# Patient Record
Sex: Female | Born: 1965
Health system: Southern US, Community
[De-identification: ages and names within clinical notes are randomized; demographics above are authoritative.]

## PROBLEM LIST (undated history)

## (undated) ENCOUNTER — Ambulatory Visit: Discharge status: Absent without leave | Payer: 59

## (undated) DIAGNOSIS — K5790 Diverticulosis of intestine, part unspecified, without perforation or abscess without bleeding: Secondary | ICD-10-CM

## (undated) DIAGNOSIS — K219 Gastro-esophageal reflux disease without esophagitis: Secondary | ICD-10-CM

## (undated) DIAGNOSIS — Z862 Personal history of diseases of the blood and blood-forming organs and certain disorders involving the immune mechanism: Secondary | ICD-10-CM

## (undated) DIAGNOSIS — I8390 Asymptomatic varicose veins of unspecified lower extremity: Secondary | ICD-10-CM

## (undated) DIAGNOSIS — H9313 Tinnitus, bilateral: Secondary | ICD-10-CM

## (undated) DIAGNOSIS — E039 Hypothyroidism, unspecified: Secondary | ICD-10-CM

## (undated) DIAGNOSIS — M722 Plantar fascial fibromatosis: Secondary | ICD-10-CM

## (undated) DIAGNOSIS — M1990 Unspecified osteoarthritis, unspecified site: Secondary | ICD-10-CM

## (undated) DIAGNOSIS — G43909 Migraine, unspecified, not intractable, without status migrainosus: Secondary | ICD-10-CM

## (undated) DIAGNOSIS — I959 Hypotension, unspecified: Secondary | ICD-10-CM

## (undated) DIAGNOSIS — Z86010 Personal history of colonic polyps: Secondary | ICD-10-CM

## (undated) HISTORY — PX: OTHER SURGICAL HISTORY: SHX169

## (undated) HISTORY — DX: Gastro-esophageal reflux disease without esophagitis: K21.9

## (undated) HISTORY — DX: Hypothyroidism, unspecified: E03.9

## (undated) HISTORY — DX: Migraine, unspecified, not intractable, without status migrainosus: G43.909

## (undated) HISTORY — PX: PLANTAR FASCIA RELEASE: SHX2239

---

## 1999-10-06 ENCOUNTER — Other Ambulatory Visit: Admission: RE | Admit: 1999-10-06 | Discharge: 1999-10-06 | Payer: Self-pay | Admitting: Obstetrics & Gynecology

## 2001-09-27 ENCOUNTER — Other Ambulatory Visit: Admission: RE | Admit: 2001-09-27 | Discharge: 2001-09-27 | Payer: Self-pay | Admitting: Obstetrics & Gynecology

## 2003-04-16 ENCOUNTER — Other Ambulatory Visit: Admission: RE | Admit: 2003-04-16 | Discharge: 2003-04-16 | Payer: Self-pay | Admitting: Obstetrics & Gynecology

## 2004-06-17 ENCOUNTER — Other Ambulatory Visit: Admission: RE | Admit: 2004-06-17 | Discharge: 2004-06-17 | Payer: Self-pay | Admitting: Obstetrics & Gynecology

## 2010-02-22 HISTORY — PX: COLONOSCOPY W/ POLYPECTOMY: SHX1380

## 2010-09-22 ENCOUNTER — Ambulatory Visit (INDEPENDENT_AMBULATORY_CARE_PROVIDER_SITE_OTHER): Payer: 59 | Admitting: Gastroenterology

## 2010-09-22 ENCOUNTER — Encounter: Payer: Self-pay | Admitting: Gastroenterology

## 2010-09-22 VITALS — BP 98/64 | HR 66 | Temp 97.9°F | Ht 66.0 in | Wt 141.2 lb

## 2010-09-22 DIAGNOSIS — R1319 Other dysphagia: Secondary | ICD-10-CM | POA: Insufficient documentation

## 2010-09-22 DIAGNOSIS — R1310 Dysphagia, unspecified: Secondary | ICD-10-CM

## 2010-09-22 DIAGNOSIS — R1032 Left lower quadrant pain: Secondary | ICD-10-CM | POA: Insufficient documentation

## 2010-09-22 DIAGNOSIS — R198 Other specified symptoms and signs involving the digestive system and abdomen: Secondary | ICD-10-CM | POA: Insufficient documentation

## 2010-09-22 DIAGNOSIS — R1314 Dysphagia, pharyngoesophageal phase: Secondary | ICD-10-CM

## 2010-09-22 DIAGNOSIS — K219 Gastro-esophageal reflux disease without esophagitis: Secondary | ICD-10-CM

## 2010-09-22 DIAGNOSIS — K625 Hemorrhage of anus and rectum: Secondary | ICD-10-CM | POA: Insufficient documentation

## 2010-09-22 NOTE — Assessment & Plan Note (Signed)
Chronic heartburn. Takes TUMS when necessary. Never started omeprazole. No previous PPIs. Complains of solid food dysphagia. Recommend EGD with dilation in the near future. I have discussed the risks, alternatives, benefits with regards to but not limited to the risk of reaction to medication, bleeding, infection, perforation and the patient is agreeable to proceed. Written consent to be obtained.  Encouraged her to begin omeprazole 20 mg daily.

## 2010-09-22 NOTE — Assessment & Plan Note (Signed)
Indeterminate left lower quadrant abdominal pain associated with loose stool. Generally lasts for less than one day. Suspect irritable bowel syndrome. Less likely IBD. Cannot exclude diverticulitis but less likely given quick resolution of symptoms. Recommend colonoscopy for further evaluation. I have discussed the risks, alternatives, benefits with regards to but not limited to the risk of reaction to medication, bleeding, infection, perforation and the patient is agreeable to proceed. Written consent to be obtained. Plan for procedure 2-3 weeks from now.

## 2010-09-22 NOTE — Progress Notes (Signed)
Cc to PCP 

## 2010-09-22 NOTE — Assessment & Plan Note (Signed)
Alternating constipation and diarrhea. Intermittent rectal bleeding. Further evaluation via colonoscopy.

## 2010-09-22 NOTE — Progress Notes (Signed)
Primary Care Physician:  Harlow Asa, MD, MD  Primary Gastroenterologist:  Roetta Sessions, MD   Chief Complaint  Patient presents with  . Colonoscopy    HPI:  Madison Aguilar is a 45 y.o. female here at the request of Dr. Lubertha South for further evaluation LLQ pain. Saw him on July 19 with complaints of left lower quadrant pain associated with loose stool. She was suspected to have diverticulitis and was started on Cipro/Flagyl. She states her symptoms were better the next day. She actually didn't start the antibiotics for several days however. She states she really has had intermittent left lower quadrant pain which may last for a day at a time in the past as well. Stools go from loose to hard. Sometimes brbpr on toilet tissue. Episodes may come 2-3 times per year. Heartburn got worse over the last few weeks. Usually food related. TUMS prn. Given omeprazole recently but she has not had that prescription filled.  Sometimes dysphagia to solids foods which has become more frequently. No weight loss. No dysuria, hematuria.   Mother had severe diverticulitis requiring ileostomy.   Current Outpatient Prescriptions  Medication Sig Dispense Refill  . levothyroxine (SYNTHROID, LEVOTHROID) 100 MCG tablet Take 100 mcg by mouth daily.        Marland Kitchen omeprazole (PRILOSEC) 20 MG capsule  not taking.         Allergies as of 09/22/2010  . (No Known Allergies)    Past Medical History  Diagnosis Date  . Migraine headache   . Hypothyroid   . GERD (gastroesophageal reflux disease)     Past Surgical History  Procedure Date  . None     Family History  Problem Relation Age of Onset  . Diverticulitis Mother     ileostomy  . Diverticulitis Maternal Grandmother   . Colon cancer Neg Hx   . Liver disease Neg Hx   . Crohn's disease Neg Hx   . Ulcerative colitis Neg Hx     History   Social History  . Marital Status: Married    Spouse Name: N/A    Number of Children: 1  . Years of Education: N/A    Occupational History  . [employer] Barrister's clerk   Social History Main Topics  . Smoking status: Current Everyday Smoker -- 0.5 packs/day    Types: Cigarettes  . Smokeless tobacco: Not on file  . Alcohol Use: Yes     a glass of wine  . Drug Use: No  . Sexually Active: Not on file   Other Topics Concern  . Not on file   Social History Narrative  . No narrative on file      ROS:  General: Negative for anorexia, weight loss, fever, chills, fatigue, weakness. Eyes: Negative for vision changes.  ENT: Negative for hoarseness, nasal congestion. CV: Negative for chest pain, angina, palpitations, dyspnea on exertion, peripheral edema.  Respiratory: Negative for dyspnea at rest, dyspnea on exertion, cough, sputum, wheezing.  GI: See history of present illness. GU:  Negative for dysuria, hematuria, urinary incontinence, urinary frequency, nocturnal urination.  MS: Negative for joint pain, low back pain.  Derm: Negative for rash or itching.  Neuro: Negative for weakness, abnormal sensation, seizure, frequent headaches, memory loss, confusion.  Psych: Negative for anxiety, depression, suicidal ideation, hallucinations.  Endo: Negative for unusual weight change.  Heme: Negative for bruising or bleeding. Allergy: Negative for rash or hives.    Physical Examination:  BP 98/64  Pulse 66  Temp(Src) 97.9 F (36.6 C) (Temporal)  Ht 5\' 6"  (1.676 m)  Wt 141 lb 3.2 oz (64.048 kg)  BMI 22.79 kg/m2   General: Well-nourished, well-developed in no acute distress.  Head: Normocephalic, atraumatic.   Eyes: Conjunctiva pink, no icterus. Mouth: Oropharyngeal mucosa moist and pink , no lesions erythema or exudate. Neck: Supple without thyromegaly, masses, or lymphadenopathy.  Lungs: Clear to auscultation bilaterally.  Heart: Regular rate and rhythm, no murmurs rubs or gallops.  Abdomen: Bowel sounds are normal, nontender, nondistended, no hepatosplenomegaly or masses, no abdominal  bruits or hernia , no rebound or guarding.   Rectal: Deferred to time of colonoscopy. Extremities: No lower extremity edema. No clubbing or deformities.  Neuro: Alert and oriented x 4 , grossly normal neurologically.  Skin: Warm and dry, no rash or jaundice.   Psych: Alert and cooperative, normal mood and affect.

## 2010-09-23 DIAGNOSIS — K5790 Diverticulosis of intestine, part unspecified, without perforation or abscess without bleeding: Secondary | ICD-10-CM

## 2010-09-23 DIAGNOSIS — Z860100 Personal history of colon polyps, unspecified: Secondary | ICD-10-CM

## 2010-09-23 HISTORY — DX: Personal history of colonic polyps: Z86.010

## 2010-09-23 HISTORY — DX: Personal history of colon polyps, unspecified: Z86.0100

## 2010-09-23 HISTORY — DX: Diverticulosis of intestine, part unspecified, without perforation or abscess without bleeding: K57.90

## 2010-10-01 NOTE — Progress Notes (Signed)
AGREE

## 2010-10-12 ENCOUNTER — Other Ambulatory Visit: Payer: Self-pay | Admitting: General Practice

## 2010-10-14 ENCOUNTER — Encounter (HOSPITAL_COMMUNITY): Admission: RE | Payer: Self-pay | Source: Ambulatory Visit

## 2010-10-14 ENCOUNTER — Ambulatory Visit (HOSPITAL_COMMUNITY): Admission: RE | Admit: 2010-10-14 | Payer: 59 | Source: Ambulatory Visit | Admitting: Internal Medicine

## 2010-10-14 ENCOUNTER — Encounter: Payer: 59 | Admitting: Internal Medicine

## 2010-10-14 SURGERY — EGD (ESOPHAGOGASTRODUODENOSCOPY)
Anesthesia: Moderate Sedation

## 2010-10-15 ENCOUNTER — Other Ambulatory Visit: Payer: Self-pay | Admitting: General Practice

## 2010-10-15 DIAGNOSIS — K625 Hemorrhage of anus and rectum: Secondary | ICD-10-CM

## 2010-10-20 ENCOUNTER — Encounter (HOSPITAL_COMMUNITY)
Admission: RE | Disposition: A | Discharge status: Home / Self Care | Payer: Self-pay | Source: Ambulatory Visit | Attending: Internal Medicine

## 2010-10-20 ENCOUNTER — Ambulatory Visit (HOSPITAL_COMMUNITY)
Admission: RE | Admit: 2010-10-20 | Discharge: 2010-10-20 | Disposition: A | Discharge status: Home / Self Care | Payer: 59 | Source: Ambulatory Visit | Attending: Internal Medicine | Admitting: Internal Medicine

## 2010-10-20 ENCOUNTER — Ambulatory Visit: Admit: 2010-10-20 | Payer: Self-pay | Admitting: Internal Medicine

## 2010-10-20 ENCOUNTER — Encounter (HOSPITAL_COMMUNITY): Payer: Self-pay | Admitting: *Deleted

## 2010-10-20 ENCOUNTER — Other Ambulatory Visit: Payer: Self-pay | Admitting: Internal Medicine

## 2010-10-20 DIAGNOSIS — R1310 Dysphagia, unspecified: Secondary | ICD-10-CM | POA: Insufficient documentation

## 2010-10-20 DIAGNOSIS — D126 Benign neoplasm of colon, unspecified: Secondary | ICD-10-CM

## 2010-10-20 DIAGNOSIS — K922 Gastrointestinal hemorrhage, unspecified: Secondary | ICD-10-CM | POA: Insufficient documentation

## 2010-10-20 DIAGNOSIS — R1032 Left lower quadrant pain: Secondary | ICD-10-CM | POA: Insufficient documentation

## 2010-10-20 DIAGNOSIS — R109 Unspecified abdominal pain: Secondary | ICD-10-CM

## 2010-10-20 DIAGNOSIS — K625 Hemorrhage of anus and rectum: Secondary | ICD-10-CM

## 2010-10-20 DIAGNOSIS — K2100 Gastro-esophageal reflux disease with esophagitis, without bleeding: Secondary | ICD-10-CM | POA: Insufficient documentation

## 2010-10-20 DIAGNOSIS — K5730 Diverticulosis of large intestine without perforation or abscess without bleeding: Secondary | ICD-10-CM

## 2010-10-20 SURGERY — COLONOSCOPY, ESOPHAGOGASTRODUODENOSCOPY (EGD) AND ESOPHAGEAL DILATION (ED)
Anesthesia: Moderate Sedation

## 2010-10-20 SURGERY — COLONOSCOPY
Anesthesia: Moderate Sedation

## 2010-10-20 MED ORDER — MIDAZOLAM HCL 5 MG/5ML IJ SOLN
INTRAMUSCULAR | Status: DC | PRN
  Administered 2010-10-20 (×2): 1 mg via INTRAVENOUS
  Administered 2010-10-20: 2 mg via INTRAVENOUS
  Administered 2010-10-20: 1 mg via INTRAVENOUS
  Administered 2010-10-20: 2 mg via INTRAVENOUS

## 2010-10-20 MED ORDER — SODIUM CHLORIDE 0.45 % IV SOLN
Freq: Once | INTRAVENOUS | Status: AC
  Administered 2010-10-20: 10:00:00 via INTRAVENOUS

## 2010-10-20 MED ORDER — MEPERIDINE HCL 100 MG/ML IJ SOLN
INTRAMUSCULAR | Status: AC
  Filled 2010-10-20: qty 2

## 2010-10-20 MED ORDER — MIDAZOLAM HCL 5 MG/5ML IJ SOLN
INTRAMUSCULAR | Status: AC
  Filled 2010-10-20: qty 10

## 2010-10-20 MED ORDER — MEPERIDINE HCL 25 MG/ML IJ SOLN
INTRAMUSCULAR | Status: DC | PRN
  Administered 2010-10-20: 50 mg via INTRAVENOUS
  Administered 2010-10-20 (×2): 25 mg via INTRAVENOUS

## 2010-10-20 NOTE — H&P (Signed)
Tana Coast, PA  09/22/2010  1:36 PM  Signed Primary Care Physician:  Harlow Asa, MD, MD   Primary Gastroenterologist:  Roetta Sessions, MD      Chief Complaint   Patient presents with   .  Colonoscopy      HPI:  Madison Aguilar is a 45 y.o. female here at the request of Dr. Lubertha South for further evaluation LLQ pain. Saw him on July 19 with complaints of left lower quadrant pain associated with loose stool. She was suspected to have diverticulitis and was started on Cipro/Flagyl. She states her symptoms were better the next day. She actually didn't start the antibiotics for several days however. She states she really has had intermittent left lower quadrant pain which may last for a day at a time in the past as well. Stools go from loose to hard. Sometimes brbpr on toilet tissue. Episodes may come 2-3 times per year. Heartburn got worse over the last few weeks. Usually food related. TUMS prn. Given omeprazole recently but she has not had that prescription filled.  Sometimes dysphagia to solids foods which has become more frequently. No weight loss. No dysuria, hematuria.    Mother had severe diverticulitis requiring ileostomy.      Current Outpatient Prescriptions   Medication  Sig  Dispense  Refill   .  levothyroxine (SYNTHROID, LEVOTHROID) 100 MCG tablet  Take 100 mcg by mouth daily.           Marland Kitchen  omeprazole (PRILOSEC) 20 MG capsule   not taking.                Allergies as of 09/22/2010   .  (No Known Allergies)       Past Medical History   Diagnosis  Date   .  Migraine headache     .  Hypothyroid     .  GERD (gastroesophageal reflux disease)         Past Surgical History   Procedure  Date   .  None         Family History   Problem  Relation  Age of Onset   .  Diverticulitis  Mother         ileostomy   .  Diverticulitis  Maternal Grandmother     .  Colon cancer  Neg Hx     .  Liver disease  Neg Hx     .  Crohn's disease  Neg Hx     .  Ulcerative colitis  Neg  Hx         History       Social History   .  Marital Status:  Married       Spouse Name:  N/A       Number of Children:  1   .  Years of Education:  N/A       Occupational History   .  [employer] school teacher       Social History Main Topics   .  Smoking status:  Current Everyday Smoker -- 0.5 packs/day       Types:  Cigarettes   .  Smokeless tobacco:  Not on file   .  Alcohol Use:  Yes         a glass of wine   .  Drug Use:  No   .  Sexually Active:  Not on file  Other Topics  Concern   .  Not on file       Social History Narrative   .  No narrative on file        ROS:   General: Negative for anorexia, weight loss, fever, chills, fatigue, weakness. Eyes: Negative for vision changes.   ENT: Negative for hoarseness, nasal congestion. CV: Negative for chest pain, angina, palpitations, dyspnea on exertion, peripheral edema.   Respiratory: Negative for dyspnea at rest, dyspnea on exertion, cough, sputum, wheezing.   GI: See history of present illness. GU:  Negative for dysuria, hematuria, urinary incontinence, urinary frequency, nocturnal urination.   MS: Negative for joint pain, low back pain.   Derm: Negative for rash or itching.   Neuro: Negative for weakness, abnormal sensation, seizure, frequent headaches, memory loss, confusion.   Psych: Negative for anxiety, depression, suicidal ideation, hallucinations.   Endo: Negative for unusual weight change.   Heme: Negative for bruising or bleeding. Allergy: Negative for rash or hives.     Physical Examination:   BP 98/64  Pulse 66  Temp(Src) 97.9 F (36.6 C) (Temporal)  Ht 5\' 6"  (1.676 m)  Wt 141 lb 3.2 oz (64.048 kg)  BMI 22.79 kg/m2    General: Well-nourished, well-developed in no acute distress.   Head: Normocephalic, atraumatic.    Eyes: Conjunctiva pink, no icterus. Mouth: Oropharyngeal mucosa moist and pink , no lesions erythema or exudate. Neck: Supple without thyromegaly, masses,  or lymphadenopathy.   Lungs: Clear to auscultation bilaterally.   Heart: Regular rate and rhythm, no murmurs rubs or gallops.   Abdomen: Bowel sounds are normal, nontender, nondistended, no hepatosplenomegaly or masses, no abdominal bruits or hernia , no rebound or guarding.    Rectal: Deferred to time of colonoscopy. Extremities: No lower extremity edema. No clubbing or deformities.   Neuro: Alert and oriented x 4 , grossly normal neurologically.   Skin: Warm and dry, no rash or jaundice.    Psych: Alert and cooperative, normal mood and affect.  Glendora Score  09/22/2010  3:42 PM  Signed Cc to PCP  Jonette Eva, MD  10/01/2010  1:32 PM  Signed AGREE        Left lower quadrant pain - Tana Coast, PA  09/22/2010  1:34 PM  Signed Indeterminate left lower quadrant abdominal pain associated with loose stool. Generally lasts for less than one day. Suspect irritable bowel syndrome. Less likely IBD. Cannot exclude diverticulitis but less likely given quick resolution of symptoms. Recommend colonoscopy for further evaluation. I have discussed the risks, alternatives, benefits with regards to but not limited to the risk of reaction to medication, bleeding, infection, perforation and the patient is agreeable to proceed. Written consent to be obtained. Plan for procedure 2-3 weeks from now.  Change in bowel function - Tana Coast, PA  09/22/2010  1:35 PM  Signed Alternating constipation and diarrhea. Intermittent rectal bleeding. Further evaluation via colonoscopy.  GERD (gastroesophageal reflux disease) - Tana Coast, PA  09/22/2010  1:36 PM  Signed Chronic heartburn. Takes TUMS when necessary. Never started omeprazole. No previous PPIs. Complains of solid food dysphagia. Recommend EGD with dilation in the near future. I have discussed the risks, alternatives, benefits with regards to but not limited to the risk of reaction to medication, bleeding, infection, perforation and the patient is agreeable to  proceed. Written consent to be obtained.   Encouraged her to begin omeprazole 20 mg daily     I have seen the patient prior to  the procedure(s) today and reviewed the history and physical / consultation from 09/22/10.  There have been no changes. After consideration of the risks, benefits, alternatives and imponderables, the patient has consented to the procedure(s).

## 2012-02-02 ENCOUNTER — Other Ambulatory Visit: Payer: Self-pay

## 2012-02-28 ENCOUNTER — Encounter (HOSPITAL_COMMUNITY): Payer: Self-pay | Admitting: Pharmacist

## 2012-02-28 NOTE — H&P (Signed)
Madison Aguilar is an 47 y.o. female. She is admitted for evaluation and treatment of persistent menorrhagia.  She has had heavy and painful periods for about 5 years.  She has been offered evaluation and treatment with Mirena in the past but she has tried to live with this problem.  She has now decided to proceed with hysteroscopy and endometrial ablation.  Pertinent Gynecological History: Menses: flow is excessive with use of 10 or more pads or tampons on heaviest days Blood transfusions: none Sexually transmitted diseases: no past history Previous GYN Procedures: Endometrial aspiration 01/2012 - Secretory endometrium.  Last mammogram: normal Date: 06/2011 Last pap: normal Date: 03/2009 OB History: G1, P1    Past Medical History  Diagnosis Date  . Migraine headache   . Hypothyroid   . GERD (gastroesophageal reflux disease)     Past Surgical History  Procedure Date  . None   . Bilateral great toe surgery     Family History  Problem Relation Age of Onset  . Diverticulitis Mother     ileostomy  . Diverticulitis Maternal Grandmother   . Colon cancer Neg Hx   . Liver disease Neg Hx   . Crohn's disease Neg Hx   . Ulcerative colitis Neg Hx     Social History:  reports that she has been smoking Cigarettes.  She has a 7.5 pack-year smoking history. She does not have any smokeless tobacco history on file. She reports that she drinks alcohol. She reports that she does not use illicit drugs.  Allergies: No Known Allergies  No prescriptions prior to admission    ROS  There were no vitals taken for this visit. Physical Exam  Height: 5'6; Weight: 134.  BP 112/60 Heart and Lungs: normal Pelvic:  Normal external genitalia, normal sized uterus with no adnexal masses.  Assessment/Plan: Persistent menorrhagia.  Will proceed with hysteroscopy to rule out endometrial pathology (polyp, submucous myoma) and follow up with NovaSure endometrial ablation.   Madison Aguilar D 02/28/2012, 2:15  PM

## 2012-03-01 ENCOUNTER — Encounter (HOSPITAL_COMMUNITY): Payer: Self-pay | Admitting: *Deleted

## 2012-03-03 ENCOUNTER — Ambulatory Visit (HOSPITAL_COMMUNITY)
Admission: RE | Admit: 2012-03-03 | Discharge: 2012-03-03 | Disposition: A | Discharge status: Home / Self Care | Payer: 59 | Source: Ambulatory Visit | Attending: Obstetrics & Gynecology | Admitting: Obstetrics & Gynecology

## 2012-03-03 ENCOUNTER — Encounter (HOSPITAL_COMMUNITY): Payer: Self-pay

## 2012-03-03 ENCOUNTER — Encounter (HOSPITAL_COMMUNITY): Payer: Self-pay | Admitting: *Deleted

## 2012-03-03 ENCOUNTER — Encounter (HOSPITAL_COMMUNITY)
Admission: RE | Disposition: A | Discharge status: Home / Self Care | Payer: Self-pay | Source: Ambulatory Visit | Attending: Obstetrics & Gynecology

## 2012-03-03 ENCOUNTER — Ambulatory Visit (HOSPITAL_COMMUNITY): Payer: 59

## 2012-03-03 DIAGNOSIS — N920 Excessive and frequent menstruation with regular cycle: Secondary | ICD-10-CM | POA: Insufficient documentation

## 2012-03-03 HISTORY — PX: DILITATION & CURRETTAGE/HYSTROSCOPY WITH NOVASURE ABLATION: SHX5568

## 2012-03-03 LAB — CBC
MCH: 30 pg (ref 26.0–34.0)
MCV: 93.5 fL (ref 78.0–100.0)
Platelets: 314 10*3/uL (ref 150–400)
RDW: 13.6 % (ref 11.5–15.5)
WBC: 8.5 10*3/uL (ref 4.0–10.5)

## 2012-03-03 LAB — PREGNANCY, URINE: Preg Test, Ur: NEGATIVE

## 2012-03-03 SURGERY — DILATATION & CURETTAGE/HYSTEROSCOPY WITH NOVASURE ABLATION
Anesthesia: General | Site: Vagina | Wound class: Clean Contaminated

## 2012-03-03 MED ORDER — EPHEDRINE 5 MG/ML INJ
INTRAVENOUS | Status: AC
  Filled 2012-03-03: qty 10

## 2012-03-03 MED ORDER — MIDAZOLAM HCL 5 MG/5ML IJ SOLN
INTRAMUSCULAR | Status: DC | PRN
  Administered 2012-03-03: 2 mg via INTRAVENOUS

## 2012-03-03 MED ORDER — DEXAMETHASONE SODIUM PHOSPHATE 10 MG/ML IJ SOLN
INTRAMUSCULAR | Status: AC
  Filled 2012-03-03: qty 1

## 2012-03-03 MED ORDER — KETOROLAC TROMETHAMINE 30 MG/ML IJ SOLN: 15.0000 mg | Freq: Once | INTRAMUSCULAR | Status: DC | PRN

## 2012-03-03 MED ORDER — FENTANYL CITRATE 0.05 MG/ML IJ SOLN
INTRAMUSCULAR | Status: DC | PRN
  Administered 2012-03-03: 100 ug via INTRAVENOUS

## 2012-03-03 MED ORDER — FENTANYL CITRATE 0.05 MG/ML IJ SOLN: 25.0000 ug | INTRAMUSCULAR | Status: DC | PRN

## 2012-03-03 MED ORDER — PROPOFOL 10 MG/ML IV EMUL
INTRAVENOUS | Status: AC
Start: 2012-03-03 — End: 2012-03-03
  Filled 2012-03-03: qty 20

## 2012-03-03 MED ORDER — EPHEDRINE SULFATE 50 MG/ML IJ SOLN
INTRAMUSCULAR | Status: DC | PRN
  Administered 2012-03-03: 10 mg via INTRAVENOUS

## 2012-03-03 MED ORDER — FENTANYL CITRATE 0.05 MG/ML IJ SOLN
INTRAMUSCULAR | Status: AC
  Filled 2012-03-03: qty 2

## 2012-03-03 MED ORDER — ONDANSETRON HCL 4 MG/2ML IJ SOLN: 4.0000 mg | Freq: Once | INTRAMUSCULAR | Status: DC | PRN

## 2012-03-03 MED ORDER — LIDOCAINE HCL (CARDIAC) 20 MG/ML IV SOLN
INTRAVENOUS | Status: AC
  Filled 2012-03-03: qty 5

## 2012-03-03 MED ORDER — MEPERIDINE HCL 25 MG/ML IJ SOLN: 6.2500 mg | INTRAMUSCULAR | Status: DC | PRN

## 2012-03-03 MED ORDER — KETOROLAC TROMETHAMINE 30 MG/ML IJ SOLN
INTRAMUSCULAR | Status: DC | PRN
  Administered 2012-03-03: 30 mg via INTRAVENOUS

## 2012-03-03 MED ORDER — ONDANSETRON HCL 4 MG/2ML IJ SOLN
INTRAMUSCULAR | Status: AC
  Filled 2012-03-03: qty 2

## 2012-03-03 MED ORDER — ONDANSETRON HCL 4 MG/2ML IJ SOLN
INTRAMUSCULAR | Status: DC | PRN
  Administered 2012-03-03: 4 mg via INTRAVENOUS

## 2012-03-03 MED ORDER — LIDOCAINE HCL (CARDIAC) 20 MG/ML IV SOLN
INTRAVENOUS | Status: DC | PRN
  Administered 2012-03-03: 50 mg via INTRAVENOUS

## 2012-03-03 MED ORDER — LIDOCAINE HCL (PF) 2 % IJ SOLN
INTRAMUSCULAR | Status: DC | PRN
  Administered 2012-03-03: 10 mL

## 2012-03-03 MED ORDER — FENTANYL CITRATE 0.05 MG/ML IJ SOLN
INTRAMUSCULAR | Status: AC
  Filled 2012-03-03: qty 5

## 2012-03-03 MED ORDER — KETOROLAC TROMETHAMINE 30 MG/ML IJ SOLN
INTRAMUSCULAR | Status: AC
  Filled 2012-03-03: qty 1

## 2012-03-03 MED ORDER — DEXAMETHASONE SODIUM PHOSPHATE 10 MG/ML IJ SOLN
INTRAMUSCULAR | Status: DC | PRN
  Administered 2012-03-03: 10 mg via INTRAVENOUS

## 2012-03-03 MED ORDER — HYDROCODONE-ACETAMINOPHEN 5-500 MG PO TABS: 1.0000 | ORAL_TABLET | ORAL | Status: DC | PRN

## 2012-03-03 MED ORDER — PROPOFOL 10 MG/ML IV EMUL
INTRAVENOUS | Status: DC | PRN
  Administered 2012-03-03: 180 mg via INTRAVENOUS

## 2012-03-03 MED ORDER — LIDOCAINE HCL 2 % IJ SOLN
INTRAMUSCULAR | Status: AC
  Filled 2012-03-03: qty 20

## 2012-03-03 MED ORDER — MIDAZOLAM HCL 2 MG/2ML IJ SOLN
INTRAMUSCULAR | Status: AC
  Filled 2012-03-03: qty 2

## 2012-03-03 MED ORDER — LACTATED RINGERS IV SOLN
INTRAVENOUS | Status: DC
  Administered 2012-03-03: 50 mL/h via INTRAVENOUS
  Administered 2012-03-03: 08:00:00 via INTRAVENOUS

## 2012-03-03 SURGICAL SUPPLY — 12 items
ABLATOR ENDOMETRIAL BIPOLAR (ABLATOR) ×2 IMPLANT
CATH ROBINSON RED A/P 16FR (CATHETERS) ×2 IMPLANT
CLOTH BEACON ORANGE TIMEOUT ST (SAFETY) ×2 IMPLANT
CONTAINER PREFILL 10% NBF 60ML (FORM) ×1 IMPLANT
DRESSING TELFA 8X3 (GAUZE/BANDAGES/DRESSINGS) ×2 IMPLANT
GLOVE ECLIPSE 6.0 STRL STRAW (GLOVE) ×4 IMPLANT
GOWN STRL REIN XL XLG (GOWN DISPOSABLE) ×4 IMPLANT
PACK HYSTEROSCOPY LF (CUSTOM PROCEDURE TRAY) ×2 IMPLANT
PAD OB MATERNITY 4.3X12.25 (PERSONAL CARE ITEMS) ×2 IMPLANT
PAD PREP 24X48 CUFFED NSTRL (MISCELLANEOUS) ×2 IMPLANT
TOWEL OR 17X24 6PK STRL BLUE (TOWEL DISPOSABLE) ×4 IMPLANT
WATER STERILE IRR 1000ML POUR (IV SOLUTION) ×2 IMPLANT

## 2012-03-03 NOTE — Anesthesia Postprocedure Evaluation (Signed)
Anesthesia Post Note  Patient: Madison Aguilar  Procedure(s) Performed: Procedure(s) (LRB): DILATATION & CURETTAGE/HYSTEROSCOPY WITH NOVASURE ABLATION (N/A)  Anesthesia type: General  Patient location: PACU  Post pain: Pain level controlled  Post assessment: Post-op Vital signs reviewed  Last Vitals:  Filed Vitals:   03/03/12 0908  BP:   Pulse:   Temp: 36.9 C  Resp:     Post vital signs: Reviewed  Level of consciousness: sedated  Complications: No apparent anesthesia complications

## 2012-03-03 NOTE — Transfer of Care (Signed)
Immediate Anesthesia Transfer of Care Note  Patient: Madison Aguilar  Procedure(s) Performed: Procedure(s) (LRB) with comments: DILATATION & CURETTAGE/HYSTEROSCOPY WITH NOVASURE ABLATION (N/A)  Patient Location: PACU  Anesthesia Type:General  Level of Consciousness: awake  Airway & Oxygen Therapy: Patient Spontanous Breathing  Post-op Assessment: Report given to PACU RN  Post vital signs: stable  Filed Vitals:   03/03/12 0626  BP: 100/67  Pulse: 54  Temp: 36.8 C  Resp: 18    Complications: No apparent anesthesia complications

## 2012-03-03 NOTE — Anesthesia Preprocedure Evaluation (Addendum)
Anesthesia Evaluation  Patient identified by MRN, date of birth, ID band Patient awake    Reviewed: Allergy & Precautions, H&P , NPO status , Patient's Chart, lab work & pertinent test results  History of Anesthesia Complications (+) PONV  Airway Mallampati: II TM Distance: >3 FB Neck ROM: full    Dental No notable dental hx.    Pulmonary neg pulmonary ROS, Current Smoker,    Pulmonary exam normal       Cardiovascular negative cardio ROS      Neuro/Psych  Headaches, negative psych ROS   GI/Hepatic Neg liver ROS, GERD-  Medicated and Controlled,  Endo/Other  Hypothyroidism   Renal/GU negative Renal ROS  negative genitourinary   Musculoskeletal negative musculoskeletal ROS (+)   Abdominal Normal abdominal exam  (+)   Peds negative pediatric ROS (+)  Hematology negative hematology ROS (+)   Anesthesia Other Findings   Reproductive/Obstetrics (+) Pregnancy                          Anesthesia Physical Anesthesia Plan  ASA: II  Anesthesia Plan: General   Post-op Pain Management:    Induction: Intravenous  Airway Management Planned: LMA  Additional Equipment:   Intra-op Plan:   Post-operative Plan:   Informed Consent: I have reviewed the patients History and Physical, chart, labs and discussed the procedure including the risks, benefits and alternatives for the proposed anesthesia with the patient or authorized representative who has indicated his/her understanding and acceptance.     Plan Discussed with: CRNA and Surgeon  Anesthesia Plan Comments:         Anesthesia Quick Evaluation

## 2012-03-03 NOTE — Progress Notes (Signed)
I have interviewed and performed the pertinent exams on my patient to confirm that there have been no significant changes in her condition since the dictation of her history and physical exam.  

## 2012-03-03 NOTE — Op Note (Signed)
Patient Name: Madison Aguilar MRN: [id number]  Date of Surgery: 03/03/2012    PREOPERATIVE DIAGNOSIS: MENORRHAGIA POSSIBLE POLYP  POSTOPERATIVE DIAGNOSIS: menorrhagia   PROCEDURE: Hysteroscopy, NovaSure endometrium ablation  SURGEON: Caralyn Guile. Arlyce Dice M.D.  ANESTHESIA: General  ESTIMATED BLOOD LOSS: * No blood loss amount entered *  FINDINGS: Normal uterine cavity.  Sounded to 8 cm.   INDICATIONS: Menorrhagia  PROCEDURE IN DETAIL: The patient was taken to the OR and placed in the dors-lithotomy position. The perineum and vagina were prepped and draped in a sterile fashion. The bladder was emptied. Bimanual exam revealed a normal sized retroverted uterus. The cervix and uterus were sounded and the cavity depth was determined to be 5.5 cm. The internal cervical os was dilated with Shawnie Pons dilators to 21 [place]. The diagnostic hysteroscope was introduced and the cavity was inspected. No pathology was identified. The internal os was dilated further to 25 [place] and the Novasure device was inserted and deployed to a width of 4.6 cm. Ablation time was 120 sec. At a power of 136. The hysteroscope was reintroduced and the cavity was intact and well cauterized. The procedure was terminated and the patient left the OR in good condition.

## 2012-03-06 ENCOUNTER — Encounter (HOSPITAL_COMMUNITY): Payer: Self-pay | Admitting: Obstetrics & Gynecology

## 2012-06-30 ENCOUNTER — Other Ambulatory Visit: Payer: Self-pay | Admitting: Family Medicine

## 2012-07-31 ENCOUNTER — Other Ambulatory Visit: Payer: Self-pay | Admitting: Dermatology

## 2012-09-19 ENCOUNTER — Ambulatory Visit (INDEPENDENT_AMBULATORY_CARE_PROVIDER_SITE_OTHER): Payer: 59 | Admitting: Family Medicine

## 2012-09-19 ENCOUNTER — Ambulatory Visit (HOSPITAL_COMMUNITY)
Admission: RE | Admit: 2012-09-19 | Discharge: 2012-09-19 | Disposition: A | Discharge status: Home / Self Care | Payer: 59 | Source: Ambulatory Visit | Attending: Family Medicine | Admitting: Family Medicine

## 2012-09-19 ENCOUNTER — Encounter: Payer: Self-pay | Admitting: Family Medicine

## 2012-09-19 VITALS — BP 102/70 | Temp 98.3°F | Wt 140.4 lb

## 2012-09-19 DIAGNOSIS — M7730 Calcaneal spur, unspecified foot: Secondary | ICD-10-CM | POA: Insufficient documentation

## 2012-09-19 DIAGNOSIS — M79609 Pain in unspecified limb: Secondary | ICD-10-CM

## 2012-09-19 DIAGNOSIS — E039 Hypothyroidism, unspecified: Secondary | ICD-10-CM

## 2012-09-19 DIAGNOSIS — K219 Gastro-esophageal reflux disease without esophagitis: Secondary | ICD-10-CM

## 2012-09-19 DIAGNOSIS — M79672 Pain in left foot: Secondary | ICD-10-CM

## 2012-09-19 DIAGNOSIS — M722 Plantar fascial fibromatosis: Secondary | ICD-10-CM

## 2012-09-19 DIAGNOSIS — Z0000 Encounter for general adult medical examination without abnormal findings: Secondary | ICD-10-CM

## 2012-09-19 MED ORDER — OMEPRAZOLE 20 MG PO CPDR: 20.0000 mg | DELAYED_RELEASE_CAPSULE | Freq: Every day | ORAL | Status: DC | PRN

## 2012-09-19 MED ORDER — LEVOTHYROXINE SODIUM 112 MCG PO TABS: 112.0000 ug | ORAL_TABLET | Freq: Every day | ORAL | Status: DC

## 2012-09-19 NOTE — Patient Instructions (Signed)
This is plantar fascitis.  advil two to three tabs up to three times per day for pain    Plantar Fasciitis Plantar fasciitis is a common condition that causes foot pain. It is soreness (inflammation) of the band of tough fibrous tissue on the bottom of the foot that runs from the heel bone (calcaneus) to the ball of the foot. The cause of this soreness may be from excessive standing, poor fitting shoes, running on hard surfaces, being overweight, having an abnormal walk, or overuse (this is common in runners) of the painful foot or feet. It is also common in aerobic exercise dancers and ballet dancers. SYMPTOMS  Most people with plantar fasciitis complain of:  Severe pain in the morning on the bottom of their foot especially when taking the first steps out of bed. This pain recedes after a few minutes of walking.  Severe pain is experienced also during walking following a long period of inactivity.  Pain is worse when walking barefoot or up stairs DIAGNOSIS   Your caregiver will diagnose this condition by examining and feeling your foot.  Special tests such as X-rays of your foot, are usually not needed. PREVENTION   Consult a sports medicine professional before beginning a new exercise program.  Walking programs offer a good workout. With walking there is a lower chance of overuse injuries common to runners. There is less impact and less jarring of the joints.  Begin all new exercise programs slowly. If problems or pain develop, decrease the amount of time or distance until you are at a comfortable level.  Wear good shoes and replace them regularly.  Stretch your foot and the heel cords at the back of the ankle (Achilles tendon) both before and after exercise.  Run or exercise on even surfaces that are not hard. For example, asphalt is better than pavement.  Do not run barefoot on hard surfaces.  If using a treadmill, vary the incline.  Do not continue to workout if you have  foot or joint problems. Seek professional help if they do not improve. HOME CARE INSTRUCTIONS   Avoid activities that cause you pain until you recover.  Use ice or cold packs on the problem or painful areas after working out.  Only take over-the-counter or prescription medicines for pain, discomfort, or fever as directed by your caregiver.  Soft shoe inserts or athletic shoes with air or gel sole cushions may be helpful.  If problems continue or become more severe, consult a sports medicine caregiver or your own health care provider. Cortisone is a potent anti-inflammatory medication that may be injected into the painful area. You can discuss this treatment with your caregiver. MAKE SURE YOU:   Understand these instructions.  Will watch your condition.  Will get help right away if you are not doing well or get worse. Document Released: 11/03/2000 Document Revised: 05/03/2011 Document Reviewed: 01/03/2008 [facility] Patient Information 2014 [place], [place].

## 2012-09-19 NOTE — Progress Notes (Signed)
  Subjective:    Patient ID: Madison Aguilar, female    DOB: Mar 05, 1965, 47 y.o.   MRN: [id number]  HPI Reflux med prn. Does help. Uses every 2 - 3 times per wk.  Uses thyroid faithfully. No symp of under or hyper thr.  Smoking about a half pack to a pack per day.  Neg bw.  Sharp pain bottom of heel  Tolerated for a long time. Pain steady and tender. Mainly left foot.  Hx of toe operation, wears heels during the day. Down to sand al lately. Wear flip at home   Review of Systems No chest pain no back pain no change in bowel habits no blood in stool no weight loss no weight gain ROS otherwise negative    Objective:   Physical Exam Alert no acute distress. HEENT normal. Thyroid nonpalpable. Lungs clear. Heart regular rate and rhythm. Abdomen benign. He'll distinct tenderness to palpation. Pulses good sensation good       Assessment & Plan:  Impression 1 hypothyroidism discussed. Asymptomatic currently #2 probable plantar fasciitis discuss at length. #Reflux clinically stable. Plan refill meds. Local measures discussed for heel. X-ray involved area. Further recommendations based results. WSL

## 2012-09-20 ENCOUNTER — Encounter: Payer: Self-pay | Admitting: Family Medicine

## 2012-09-26 ENCOUNTER — Other Ambulatory Visit: Payer: Self-pay

## 2012-10-02 DIAGNOSIS — M722 Plantar fascial fibromatosis: Secondary | ICD-10-CM | POA: Insufficient documentation

## 2012-10-02 DIAGNOSIS — E039 Hypothyroidism, unspecified: Secondary | ICD-10-CM | POA: Insufficient documentation

## 2012-10-05 LAB — TSH: TSH: 0.136 u[IU]/mL — ABNORMAL LOW (ref 0.350–4.500)

## 2012-10-05 LAB — LIPID PANEL
LDL Cholesterol: 108 mg/dL — ABNORMAL HIGH (ref 0–99)
Triglycerides: 67 mg/dL (ref <150)

## 2012-10-11 MED ORDER — LEVOTHYROXINE SODIUM 100 MCG PO TABS: 100.0000 ug | ORAL_TABLET | Freq: Every day | ORAL | Status: DC

## 2012-10-11 NOTE — Addendum Note (Signed)
Addended by: Margaretha Sheffield on: 10/11/2012 09:42 AM   Modules accepted: Orders, Medications

## 2012-12-28 ENCOUNTER — Other Ambulatory Visit: Payer: Self-pay

## 2013-04-19 ENCOUNTER — Ambulatory Visit: Payer: 59 | Admitting: Podiatry

## 2013-04-23 ENCOUNTER — Ambulatory Visit (INDEPENDENT_AMBULATORY_CARE_PROVIDER_SITE_OTHER): Payer: 59 | Admitting: Podiatry

## 2013-04-23 ENCOUNTER — Encounter: Payer: Self-pay | Admitting: Podiatry

## 2013-04-23 ENCOUNTER — Ambulatory Visit (INDEPENDENT_AMBULATORY_CARE_PROVIDER_SITE_OTHER): Payer: 59

## 2013-04-23 VITALS — BP 93/56 | HR 61 | Resp 16 | Ht 65.0 in | Wt 138.0 lb

## 2013-04-23 DIAGNOSIS — M722 Plantar fascial fibromatosis: Secondary | ICD-10-CM

## 2013-04-23 MED ORDER — TRIAMCINOLONE ACETONIDE 10 MG/ML IJ SUSP
10.0000 mg | Freq: Once | INTRAMUSCULAR | Status: AC
  Administered 2013-04-23: 10 mg

## 2013-04-23 MED ORDER — DICLOFENAC SODIUM 75 MG PO TBEC: 75.0000 mg | DELAYED_RELEASE_TABLET | Freq: Two times a day (BID) | ORAL | Status: DC

## 2013-04-23 NOTE — Patient Instructions (Signed)

## 2013-04-23 NOTE — Progress Notes (Signed)
   Subjective:    Patient ID: Madison Aguilar, female    DOB: 10/04/1965, 48 y.o.   MRN: [id number]  HPI Comments: "I have pain in my heel that is making my whole foot hurt"  Patient C/O throbbing pain plantar heel left for 1 year. Has gotten worse within the last few months. She has been limping a lot lately that is making her lateral ankle hurt. She does have AM pain. She went to see her PCP and he xrayed and said she had plantar fasciitis. PCP recommended stretching exercises. She has also been taking Advil prn.  Foot Pain Associated symptoms include arthralgias.      Review of Systems  Musculoskeletal: Positive for arthralgias and gait problem.  All other systems reviewed and are negative.       Objective:   Physical Exam        Assessment & Plan:

## 2013-04-24 NOTE — Progress Notes (Signed)
Subjective:     Patient ID: Madison Aguilar, female   DOB: 11-Aug-1965, 48 y.o.   MRN: [id number]  Foot Pain   patient presents with painful heels of approximate one-year duration. States it has gotten much worse over the last 3-4 months and she is having trouble walking or doing any type of activity   Review of Systems  All other systems reviewed and are negative.       Objective:   Physical Exam  Nursing note and vitals reviewed. Constitutional: She is oriented to person, place, and time.  Cardiovascular: Intact distal pulses.   Musculoskeletal: Normal range of motion.  Neurological: She is oriented to person, place, and time.  Skin: Skin is warm.   neurovascular status intact with muscle strength adequate range of motion within normal limits and no equinus condition noted of both feet. Patient is found to have intense discomfort plantar aspect left heel at the insertional point of the tendon into the calcaneus    Assessment:     Plantar fasciitis left heel with inflammation and fluid around the medial band    Plan:     H&P and x-rays reviewed. Injected the plantar fascia 3 mg Kenalog 5 mg Xylocaine Marcaine mixture and applied fascially brace at this time. Placed on [place] and 75 mg twice a day and reappoint in the next 2 weeks

## 2013-05-02 ENCOUNTER — Ambulatory Visit: Payer: 59 | Admitting: Podiatry

## 2013-05-09 ENCOUNTER — Ambulatory Visit (INDEPENDENT_AMBULATORY_CARE_PROVIDER_SITE_OTHER): Payer: 59 | Admitting: Podiatry

## 2013-05-09 ENCOUNTER — Encounter: Payer: Self-pay | Admitting: Podiatry

## 2013-05-09 VITALS — BP 103/59 | HR 56 | Resp 14 | Ht 66.0 in | Wt 135.0 lb

## 2013-05-09 DIAGNOSIS — M722 Plantar fascial fibromatosis: Secondary | ICD-10-CM

## 2013-05-09 MED ORDER — TRIAMCINOLONE ACETONIDE 10 MG/ML IJ SUSP
10.0000 mg | Freq: Once | INTRAMUSCULAR | Status: AC
  Administered 2013-05-09: 10 mg

## 2013-05-09 NOTE — Progress Notes (Signed)
   Subjective:    Patient ID: Madison Aguilar, female    DOB: May 15, 1965, 48 y.o.   MRN: [id number] Pt states hasn't worn the brace since if broke after 3 days.  Pt states the shot lasted 4 days, now feeling the same pain, but more up in the heel. HPI    Review of Systems     Objective:   Physical Exam        Assessment & Plan:

## 2013-05-10 NOTE — Progress Notes (Signed)
Subjective:     Patient ID: Madison Aguilar, female   DOB: [birth year], 48 y.o.   MRN: [id number]  HPI patient stated the injection lasted around 4 days and then complete reoccurrence of the pain in her he'll return at that time   Review of Systems     Objective:   Physical Exam Neurovascular status intact with severe discomfort left medial heel at the insertional point of the tendon into the calcaneus with inflammation and fluid noted around this area with mechanical dysfunction of the arch and lowering of the arch noted    Assessment:     Plantar fasciitis of an acute intense nature with chronic issue also noted with condition    Plan:     Spent a great deal of time reviewing condition and at this time reinjected the plantar fascia 3 mg Kenalog 5 mg a Marcaine mixture dispensed night splint with instructions on usage discussed physical therapy and scanned for custom orthotics to reduce stress against the heel. Discussed that someday this still may require surgery or shockwave therapy

## 2013-05-31 ENCOUNTER — Encounter: Payer: 59 | Admitting: Podiatry

## 2013-06-11 ENCOUNTER — Encounter: Payer: Self-pay | Admitting: Podiatry

## 2013-06-11 ENCOUNTER — Ambulatory Visit (INDEPENDENT_AMBULATORY_CARE_PROVIDER_SITE_OTHER): Payer: 59 | Admitting: Podiatry

## 2013-06-11 VITALS — BP 101/57 | HR 57 | Resp 16

## 2013-06-11 DIAGNOSIS — M722 Plantar fascial fibromatosis: Secondary | ICD-10-CM

## 2013-06-11 NOTE — Patient Instructions (Signed)

## 2013-06-13 NOTE — Progress Notes (Signed)
Subjective:     Patient ID: Madison Aguilar, female   DOB: [birth year], 48 y.o.   MRN: [id number]  HPI patient states it's doing well overall with mild discomfort with palpation or excessive ambulation   Review of Systems     Objective:   Physical Exam Neurovascular status intact with diminishment of discomfort in the plantar heel region left with fluid buildup still noted but improved from previous visit    Assessment:     Improving plantar fasciitis left heel    Plan:     H&P performed and advised on physical therapy supportive shoe and dispensed orthotics with all instructions on usage. Reappoint her recheck again in the next month

## 2013-07-02 ENCOUNTER — Ambulatory Visit: Payer: 59 | Admitting: Podiatry

## 2013-07-10 ENCOUNTER — Ambulatory Visit (INDEPENDENT_AMBULATORY_CARE_PROVIDER_SITE_OTHER): Payer: 59 | Admitting: Family Medicine

## 2013-07-10 ENCOUNTER — Encounter: Payer: Self-pay | Admitting: Family Medicine

## 2013-07-10 VITALS — BP 94/72 | Ht 64.0 in | Wt 138.0 lb

## 2013-07-10 DIAGNOSIS — R51 Headache: Secondary | ICD-10-CM

## 2013-07-10 MED ORDER — ETODOLAC 400 MG PO TABS: 400.0000 mg | ORAL_TABLET | Freq: Two times a day (BID) | ORAL | Status: DC

## 2013-07-10 MED ORDER — CHLORZOXAZONE 500 MG PO TABS: 500.0000 mg | ORAL_TABLET | Freq: Three times a day (TID) | ORAL | Status: DC

## 2013-07-10 NOTE — Progress Notes (Signed)
   Subjective:    Patient ID: Madison Aguilar, female    DOB: [birth year], 48 y.o.   MRN: [id number]  Headache  This is a new problem. The current episode started in the past 7 days. The problem occurs daily. The problem has been waxing and waning. Pain location: Back of head and neck. The pain does not radiate. The pain quality is not similar to prior headaches. Associated symptoms include coughing, nausea, neck pain, scalp tenderness, sinus pressure and a visual change. Exacerbated by: Bending over. She has tried NSAIDs for the symptoms. The treatment provided mild relief. Her past medical history is significant for migraine headaches.   Work now so much better  [employer]--, Started about ten da go  Hx of migr  Pain in the back of the head started, Moved to back of the neck, wondered if it was allergies  Now advil prn,, A little cong advil two every four hrs  No rad to hands, worse with motion to  Felt swollen and tender to touch   Some dizziness early on   Slight sinus pressure  pai at night and in the am Wakes up every morn with it  Review of Systems  HENT: Positive for sinus pressure.   Respiratory: Positive for cough.   Gastrointestinal: Positive for nausea.  Musculoskeletal: Positive for neck pain.  Neurological: Positive for headaches.       Objective:   Physical Exam  Alert no acute distress. Lungs clear. Heart regular in rhythm. Vitals reviewed. Posterior neck tender to deep palpation paraspinal region cervical. Distal arm strength sensation reflexes all intact. Pulses good. Strength intact. Fundi discs sharp. Pharynx normal neurological exam stable.      Assessment & Plan:  Impression subacute posterior cervical headache with radiation to occiput. The patient has only risk factors, however high likelihood is this is benign secondary to musculoskeletal etiology. Doubt neuropathic. Highly doubt stroke. Highly doubt intracranial process. Very lengthy discussion held.  25 minutes spent most in discussion. Plan no imaging at this time. Rationale discussed. Anti-inflammatory and specimen some prescribed. If persists a couple more weeks return for further evaluation. WSL

## 2013-07-18 ENCOUNTER — Ambulatory Visit: Payer: 59 | Admitting: Podiatry

## 2013-08-06 ENCOUNTER — Encounter: Payer: Self-pay | Admitting: Podiatry

## 2013-08-06 ENCOUNTER — Ambulatory Visit (INDEPENDENT_AMBULATORY_CARE_PROVIDER_SITE_OTHER): Payer: 59 | Admitting: Podiatry

## 2013-08-06 VITALS — BP 116/60 | HR 70 | Resp 16

## 2013-08-06 DIAGNOSIS — M722 Plantar fascial fibromatosis: Secondary | ICD-10-CM

## 2013-08-06 MED ORDER — TRIAMCINOLONE ACETONIDE 10 MG/ML IJ SUSP
10.0000 mg | Freq: Once | INTRAMUSCULAR | Status: AC
  Administered 2013-08-06: 10 mg

## 2013-08-06 NOTE — Progress Notes (Signed)
Subjective:     Patient ID: Madison Aguilar, female   DOB: [birth year], 48 y.o.   MRN: [id number]  HPI patient presents stating I was doing really well but I was on my feet excessively without my orthotics and I redeveloped plantar heel pain  Review of Systems     Objective:   Physical Exam Neurovascular status intact with discomfort in the plantar fascia at the insertion of the tendon into the calcaneus left    Assessment:     Plan her fasciitis which has returned secondary to patient habits left foot    Plan:     Instructed on the importance of night splint orthotic usage and reduced activity and not going barefoot and today reinjected the plantar fascia 3 mg Kenalog 5 mg I can Marcaine mixture and return if symptoms persist

## 2013-10-02 ENCOUNTER — Telehealth: Payer: Self-pay | Admitting: *Deleted

## 2013-10-02 NOTE — Telephone Encounter (Signed)
I want to talk with him about some problems I'm having.

## 2013-10-02 NOTE — Telephone Encounter (Signed)
I attempted to return her call.  I left a message to call me on tomorrow.

## 2013-10-03 ENCOUNTER — Encounter: Payer: Self-pay | Admitting: Podiatry

## 2013-10-03 ENCOUNTER — Ambulatory Visit (INDEPENDENT_AMBULATORY_CARE_PROVIDER_SITE_OTHER): Payer: 59 | Admitting: Podiatry

## 2013-10-03 VITALS — BP 104/61 | HR 64 | Resp 16

## 2013-10-03 DIAGNOSIS — M722 Plantar fascial fibromatosis: Secondary | ICD-10-CM

## 2013-10-03 NOTE — Patient Instructions (Signed)
Pre-Operative Instructions  Congratulations, you have decided to take an important step to improving your quality of life.  You can be assured that the doctors of [facility] will be with you every step of the way.  1. Plan to be at the surgery center/hospital at least 1 (one) hour prior to your scheduled time unless otherwise directed by the surgical center/hospital staff.  You must have a responsible adult accompany you, remain during the surgery and drive you home.  Make sure you have directions to the surgical center/hospital and know how to get there on time. 2. For hospital based surgery you will need to obtain a history and physical form from your family physician within 1 month prior to the date of surgery- we will give you a form for you primary physician.  3. We make every effort to accommodate the date you request for surgery.  There are however, times where surgery dates or times have to be moved.  We will contact you as soon as possible if a change in schedule is required.   4. No Aspirin/Ibuprofen for one week before surgery.  If you are on aspirin, any non-steroidal anti-inflammatory medications (Mobic, Aleve, Ibuprofen) you should stop taking it 7 days prior to your surgery.  You make take Tylenol  For pain prior to surgery.  5. Medications- If you are taking daily heart and blood pressure medications, seizure, reflux, allergy, asthma, anxiety, pain or diabetes medications, make sure the surgery center/hospital is aware before the day of surgery so they may notify you which medications to take or avoid the day of surgery. 6. No food or drink after midnight the night before surgery unless directed otherwise by surgical center/hospital staff. 7. No alcoholic beverages 24 hours prior to surgery.  No smoking 24 hours prior to or 24 hours after surgery. 8. Wear loose pants or shorts- loose enough to fit over bandages, boots, and casts. 9. No slip on shoes, sneakers are best. 10. Bring  your boot with you to the surgery center/hospital.  Also bring crutches or a walker if your physician has prescribed it for you.  If you do not have this equipment, it will be provided for you after surgery. 11. If you have not been contracted by the surgery center/hospital by the day before your surgery, call to confirm the date and time of your surgery. 12. Leave-time from work may vary depending on the type of surgery you have.  Appropriate arrangements should be made prior to surgery with your employer. 13. Prescriptions will be provided immediately following surgery by your doctor.  Have these filled as soon as possible after surgery and take the medication as directed. 14. Remove nail polish on the operative foot. 15. Wash the night before surgery.  The night before surgery wash the foot and leg well with the antibacterial soap provided and water paying special attention to beneath the toenails and in between the toes.  Rinse thoroughly with water and dry well with a towel.  Perform this wash unless told not to do so by your physician.  Enclosed: 1 Ice pack (please put in freezer the night before surgery)   1 Hibiclens skin cleaner   Pre-op Instructions  If you have any questions regarding the instructions, do not hesitate to call our office.  [place]: [address] [place], [place] [address] [phone]  [place]: [address] [place], [place] [address] [phone]  [place]: [address]  [place], [place] [address] [phone]  Dr. Richard   Tuchman DPM, Dr. Norman Regal DPM Dr. Richard Sikora DPM, Dr. M. Todd Hyatt DPM, Dr. Kathryn Egerton DPM 

## 2013-10-04 NOTE — Progress Notes (Signed)
Subjective:     Patient ID: Madison Aguilar, female   DOB: 04/23/1965, 48 y.o.   MRN: [id number]  HPI patient states that my left heel has never really gotten better and I am now ready to have this fixed. States it's been ongoing and is simply not responded to conservative care and I only gets short-term relief with treatment we've tried and this is now been going on for at least 18 months   Review of Systems     Objective:   Physical Exam Neurovascular status intact with muscle strength adequate range of motion within normal limits and severe discomfort to palpation medial band left plantar fascia at the insertion into the calcaneus    Assessment:     Chronic plantar fasciitis left with inflammation and fluid buildup    Plan:     Reviewed condition length of time she's had problems and all different conservative treatments we have tried. Patient wants this fixed and I have recommended endoscopic release of the fascially band and patient wants surgery at this time and I allowed her to read a consent form went over all possible complications including chronic arch pain lateral foot pain worsening of symptoms and explained that total recovery. Can take around 6 months. Patient wants procedure understanding risk and signs consent form and is scheduled for outpatient surgery

## 2013-10-09 DIAGNOSIS — M722 Plantar fascial fibromatosis: Secondary | ICD-10-CM

## 2013-10-10 ENCOUNTER — Telehealth: Payer: Self-pay

## 2013-10-10 NOTE — Telephone Encounter (Signed)
Spoke with pt regarding post op status. She states that she is managing her pain well, and keeping her foot elevated. Advised to watch for s/s of infection ie fever, n/v. Keep sterile dressing dry and wear boot throughout the day and night.

## 2013-10-10 NOTE — Telephone Encounter (Signed)
Close encounter 

## 2013-10-10 NOTE — Progress Notes (Signed)
Dr Paulla Dolly performed a left endoscopic plantar fasciotomy DOS 10/09/13.

## 2013-10-12 ENCOUNTER — Telehealth: Payer: Self-pay | Admitting: *Deleted

## 2013-10-12 NOTE — Telephone Encounter (Signed)
I returned her call.  She stated it is too late now, I was calling to see if it was okay for me to go to work for a few hours.  I apologized to her.  She stated it's no problem I have an appointment to see him on Monday.

## 2013-10-12 NOTE — Telephone Encounter (Signed)
Thank you :)

## 2013-10-15 ENCOUNTER — Encounter: Payer: Self-pay | Admitting: Podiatry

## 2013-10-15 ENCOUNTER — Ambulatory Visit (INDEPENDENT_AMBULATORY_CARE_PROVIDER_SITE_OTHER): Payer: 59 | Admitting: Podiatry

## 2013-10-15 VITALS — BP 105/52 | HR 78 | Resp 12 | Ht 66.0 in | Wt 136.0 lb

## 2013-10-15 DIAGNOSIS — M722 Plantar fascial fibromatosis: Secondary | ICD-10-CM

## 2013-10-15 NOTE — Progress Notes (Signed)
Subjective:     Patient ID: Madison Aguilar, female   DOB: May 17, 1965, 48 y.o.   MRN: [id number]  HPI patient states my heel feels real good and I'm having minimal discomfort or swelling   Review of Systems     Objective:   Physical Exam Neurovascular status intact with negative Homans sign noted and well-healing surgical sites left medial and lateral heel with stitches intact and wound edges well: Coapted    Assessment:     Healing well from endoscopic surgery left    Plan:     Reapplied sterile dressing and instructed on continued boot usage for the next 2 weeks until sutures are removed and continue with compression

## 2013-10-15 NOTE — Progress Notes (Signed)
   Subjective:    Patient ID: Madison Aguilar, female    DOB: 06-14-1965, 48 y.o.   MRN: [id number]  HPI Comments: DOS 10/09/2013 left endoscopic plantar fasciitis     Review of Systems     Objective:   Physical Exam        Assessment & Plan:

## 2013-10-20 ENCOUNTER — Other Ambulatory Visit: Payer: Self-pay | Admitting: Family Medicine

## 2013-10-30 ENCOUNTER — Ambulatory Visit: Payer: 59

## 2013-10-30 DIAGNOSIS — Z4802 Encounter for removal of sutures: Secondary | ICD-10-CM

## 2013-10-30 NOTE — Progress Notes (Signed)
   Subjective:    Patient ID: Madison Aguilar, female    DOB: [birth year], 48 y.o.   MRN: [id number]  HPI  Pt presents for suture removal  Review of Systems     Objective:   Physical Exam  Sutures removed, wound edges closed aligned and approximated, no drainage noted, afebrile      Assessment & Plan:  Pt put in Darco shoe and compression stocking, reappointed to come back in 2 weeks to f/u with Dr Paulla Dolly

## 2013-11-15 ENCOUNTER — Encounter: Payer: 59 | Admitting: Podiatry

## 2013-11-22 ENCOUNTER — Encounter: Payer: Self-pay | Admitting: Podiatry

## 2013-11-22 ENCOUNTER — Ambulatory Visit (INDEPENDENT_AMBULATORY_CARE_PROVIDER_SITE_OTHER): Payer: 59 | Admitting: Podiatry

## 2013-11-22 VITALS — BP 102/60 | HR 66 | Resp 16

## 2013-11-22 DIAGNOSIS — L600 Ingrowing nail: Secondary | ICD-10-CM

## 2013-11-25 NOTE — Progress Notes (Signed)
Subjective:     Patient ID: Madison Aguilar, female   DOB: [birth year], 48 y.o.   MRN: [id number]  HPI patient states doing really well with my heel with minimal discomfort   Review of Systems     Objective:   Physical Exam Neurovascular status intact with significant diminishment of discomfort left plantar heel with minimal arch pain noted    Assessment:    improved from plan her fasciitis surgery left with good alignment noted    Plan:     Instructed on physical therapy anti-inflammatories to be taken also discussed nail disease and considerations for treatment at one point in future.

## 2014-04-24 ENCOUNTER — Other Ambulatory Visit: Payer: Self-pay | Admitting: Dermatology

## 2014-05-06 ENCOUNTER — Encounter: Payer: Self-pay | Admitting: Family Medicine

## 2014-05-06 ENCOUNTER — Ambulatory Visit (INDEPENDENT_AMBULATORY_CARE_PROVIDER_SITE_OTHER): Payer: 59 | Admitting: Family Medicine

## 2014-05-06 VITALS — BP 110/70 | Temp 98.4°F | Ht 64.0 in | Wt 142.0 lb

## 2014-05-06 DIAGNOSIS — E039 Hypothyroidism, unspecified: Secondary | ICD-10-CM | POA: Diagnosis not present

## 2014-05-06 DIAGNOSIS — J329 Chronic sinusitis, unspecified: Secondary | ICD-10-CM | POA: Diagnosis not present

## 2014-05-06 DIAGNOSIS — J310 Chronic rhinitis: Secondary | ICD-10-CM

## 2014-05-06 MED ORDER — AZITHROMYCIN 250 MG PO TABS: ORAL_TABLET | ORAL | Status: DC

## 2014-05-06 NOTE — Progress Notes (Signed)
   Subjective:    Patient ID: Madison Aguilar, female    DOB: 1965/05/20, 49 y.o.   MRN: [id number]  Sinusitis This is a new problem. The current episode started in the past 7 days. The problem is unchanged. There has been no fever. The pain is moderate. Associated symptoms include congestion, ear pain, headaches, sinus pressure and sneezing. Past treatments include nothing. The treatment provided no relief.   Patient states that she has no other concerns at this time.   Started sneezing  Dev sinus pressure  And ear pain both eyes  Frontal headache    Review of Systems  HENT: Positive for congestion, ear pain, sinus pressure and sneezing.   Neurological: Positive for headaches.   no vomiting or diarrhea     Objective:   Physical Exam  Alert moderate nasal congestion frontal and maxillary tenderness pharynx are erythema neck supple lungs clear heart regular in rhythm      Assessment & Plan:  Impression 1 rhinosinusitis #2 hypothyroidism status uncertain plan appropriate blood work. Antibiotics prescribed. Symptom care discussed. WSL

## 2014-05-18 LAB — TSH: TSH: 0.265 u[IU]/mL — ABNORMAL LOW (ref 0.450–4.500)

## 2014-05-21 MED ORDER — LEVOTHYROXINE SODIUM 88 MCG PO TABS: ORAL_TABLET | ORAL | Status: DC

## 2014-05-21 NOTE — Addendum Note (Signed)
Addended byCharolotte Capuchin D on: 05/21/2014 12:28 PM   Modules accepted: Orders

## 2014-05-21 NOTE — Progress Notes (Signed)
New med sent. Pt notified and verbalized understanding.

## 2014-07-03 ENCOUNTER — Other Ambulatory Visit: Payer: Self-pay | Admitting: Family Medicine

## 2014-11-01 ENCOUNTER — Encounter: Payer: Self-pay | Admitting: Family Medicine

## 2014-11-01 ENCOUNTER — Ambulatory Visit (INDEPENDENT_AMBULATORY_CARE_PROVIDER_SITE_OTHER): Payer: 59 | Admitting: Family Medicine

## 2014-11-01 VITALS — Temp 98.6°F | Ht 64.0 in | Wt 143.0 lb

## 2014-11-01 DIAGNOSIS — J329 Chronic sinusitis, unspecified: Secondary | ICD-10-CM

## 2014-11-01 DIAGNOSIS — M7042 Prepatellar bursitis, left knee: Secondary | ICD-10-CM | POA: Diagnosis not present

## 2014-11-01 DIAGNOSIS — J310 Chronic rhinitis: Secondary | ICD-10-CM

## 2014-11-01 MED ORDER — AZITHROMYCIN 250 MG PO TABS: ORAL_TABLET | ORAL | Status: DC

## 2014-11-01 NOTE — Progress Notes (Signed)
   Subjective:    Patient ID: Madison Aguilar, female    DOB: 10-15-1965, 49 y.o.   MRN: [id number]  Sinusitis This is a new problem. The current episode started yesterday. The problem is unchanged. There has been no fever. Associated symptoms include congestion, coughing, ear pain and headaches. Past treatments include nothing. The treatment provided no relief.  sinus dranage and cough  Some productive geen at time s Patient states that she has left knee swelling.  Left knee sweling at times, painful with bending, notes intermittent swelling. No history of prior injury  Puffy at ti  She needs to talk to the doctor about this also.    Review of Systems  HENT: Positive for congestion and ear pain.   Respiratory: Positive for cough.   Neurological: Positive for headaches.       Objective:   Physical Exam  Alert vital stable HEENT moderate nasal congestion frontal tenderness no wheeze bronchial cough left knee prepatellar effusion palpable no laxity      Assessment & Plan:  Impression 1 rhinosinusitis #2 prepatellar bursitis discussed at length plan antibiotics prescribed. Symptom care discussed WSL

## 2014-11-01 NOTE — Patient Instructions (Signed)
Prepatellar bursitis 

## 2014-11-08 ENCOUNTER — Other Ambulatory Visit: Payer: Self-pay | Admitting: Family Medicine

## 2014-12-16 ENCOUNTER — Other Ambulatory Visit: Payer: Self-pay | Admitting: Family Medicine

## 2014-12-23 ENCOUNTER — Telehealth: Payer: Self-pay | Admitting: Family Medicine

## 2014-12-23 DIAGNOSIS — E039 Hypothyroidism, unspecified: Secondary | ICD-10-CM

## 2014-12-23 DIAGNOSIS — Z139 Encounter for screening, unspecified: Secondary | ICD-10-CM

## 2014-12-23 DIAGNOSIS — Z13220 Encounter for screening for lipoid disorders: Secondary | ICD-10-CM

## 2014-12-23 NOTE — Telephone Encounter (Signed)
Glu lip tsh

## 2014-12-23 NOTE — Telephone Encounter (Signed)
Orders ready. Pt notified.  

## 2014-12-23 NOTE — Telephone Encounter (Signed)
Pt is needing lab orders sent over for her upcoming appt. Last labs per epic were: tsh on 05/17/14

## 2014-12-26 LAB — LIPID PANEL
Chol/HDL Ratio: 3.1 ratio units (ref 0.0–4.4)
Cholesterol, Total: 181 mg/dL (ref 100–199)
HDL: 59 mg/dL (ref >39)
LDL CALC: 109 mg/dL — AB (ref 0–99)
Triglycerides: 66 mg/dL (ref 0–149)
VLDL CHOLESTEROL CAL: 13 mg/dL (ref 5–40)

## 2014-12-26 LAB — TSH: TSH: 3.63 u[IU]/mL (ref 0.450–4.500)

## 2014-12-26 LAB — GLUCOSE, RANDOM: GLUCOSE: 91 mg/dL (ref 65–99)

## 2015-01-14 ENCOUNTER — Telehealth: Payer: Self-pay | Admitting: Family Medicine

## 2015-01-14 ENCOUNTER — Ambulatory Visit: Payer: Commercial Managed Care - HMO | Admitting: Family Medicine

## 2015-01-14 MED ORDER — LEVOTHYROXINE SODIUM 88 MCG PO TABS: ORAL_TABLET | ORAL | Status: DC

## 2015-01-14 NOTE — Telephone Encounter (Signed)
Patient requesting Rx for levothyroxine to [facility].  She had to cancel her appointment for this afternoon because she is very busy at work.  She wants to know if Dr. Richardson Landry needs to still see her since her BW has been completed?  If so, she says she can schedule an appointment at the beginning of the new year.

## 2015-01-14 NOTE — Telephone Encounter (Signed)
Left message to return call- (rx sent electronically to pharmacy) patient needs to reschedule office visit for January

## 2015-01-14 NOTE — Telephone Encounter (Signed)
Discussed with patient. Patient scheduled follow up office visit in January.

## 2015-02-12 ENCOUNTER — Other Ambulatory Visit: Payer: Self-pay | Admitting: Family Medicine

## 2015-03-04 ENCOUNTER — Ambulatory Visit (INDEPENDENT_AMBULATORY_CARE_PROVIDER_SITE_OTHER): Payer: 59 | Admitting: Family Medicine

## 2015-03-04 ENCOUNTER — Encounter: Payer: Self-pay | Admitting: Family Medicine

## 2015-03-04 VITALS — BP 112/62 | Ht 64.0 in | Wt 145.0 lb

## 2015-03-04 DIAGNOSIS — Z720 Tobacco use: Secondary | ICD-10-CM | POA: Diagnosis not present

## 2015-03-04 DIAGNOSIS — Z716 Tobacco abuse counseling: Secondary | ICD-10-CM

## 2015-03-04 DIAGNOSIS — K219 Gastro-esophageal reflux disease without esophagitis: Secondary | ICD-10-CM | POA: Diagnosis not present

## 2015-03-04 DIAGNOSIS — E039 Hypothyroidism, unspecified: Secondary | ICD-10-CM | POA: Diagnosis not present

## 2015-03-04 MED ORDER — LEVOTHYROXINE SODIUM 88 MCG PO TABS: ORAL_TABLET | ORAL | Status: DC

## 2015-03-04 MED ORDER — VARENICLINE TARTRATE 0.5 MG X 11 & 1 MG X 42 PO MISC: ORAL | Status: DC

## 2015-03-04 MED ORDER — OMEPRAZOLE 20 MG PO CPDR: 20.0000 mg | DELAYED_RELEASE_CAPSULE | Freq: Every day | ORAL | Status: DC

## 2015-03-04 MED ORDER — VARENICLINE TARTRATE 1 MG PO TABS: 1.0000 mg | ORAL_TABLET | Freq: Two times a day (BID) | ORAL | Status: DC

## 2015-03-04 NOTE — Progress Notes (Signed)
   Subjective:    Patient ID: Madison Aguilar, female    DOB: 07/02/1965, 50 y.o.   MRN: [id number]  HPIMed checkup.  Hypothyrodism. Taking levothyroxine 60mcg. claims compliance with meds has not missed a dose no high or low thyroid symptomatology  Pt wants to discuss med to help stop smoking. Patient has smoked for many years. Has cut down about half pack a day. At times notes shortness of breath with exertion. On occasion will have a cough though not regularly. Significant family history of recent in law developing lung cancer. This obviously has crepitations attention. Her husband quit smoking and she would like to also    Review of Systems    no headache no chest pain no back pain no abdominal pain Objective:   Physical Exam   Alert vitals stable no acute distress thyroid nonpalpable neck supple. Lungs clear heart regular in rhythm.     Assessment & Plan:  Impression 1 hypothyroidism controlled good discuss #2 reflux clinically stable on meds meds to maintain same #3 chronic smoker long discussion held about pluses and minuses of various medications plan patient would like a prescription for Chantix. Proper use side effects benefits discussed. Maintain thyroid for 1 year exercise encourage maintain omeprazole rationale discussed WSL

## 2015-03-04 NOTE — Patient Instructions (Signed)
Smoking Cessation, Tips for Success If you are ready to quit smoking, congratulations! You have chosen to help yourself be healthier. Cigarettes bring nicotine, tar, carbon monoxide, and other irritants into your body. Your lungs, heart, and blood vessels will be able to work better without these poisons. There are many different ways to quit smoking. Nicotine gum, nicotine patches, a nicotine inhaler, or nicotine nasal spray can help with physical craving. Hypnosis, support groups, and medicines help break the habit of smoking. WHAT THINGS CAN I DO TO MAKE QUITTING EASIER?  Here are some tips to help you quit for good:  Pick a date when you will quit smoking completely. Tell all of your friends and family about your plan to quit on that date.  Do not try to slowly cut down on the number of cigarettes you are smoking. Pick a quit date and quit smoking completely starting on that day.  Throw away all cigarettes.   Clean and remove all ashtrays from your home, work, and car.  On a card, write down your reasons for quitting. Carry the card with you and read it when you get the urge to smoke.  Cleanse your body of nicotine. Drink enough water and fluids to keep your urine clear or pale yellow. Do this after quitting to flush the nicotine from your body.  Learn to predict your moods. Do not let a bad situation be your excuse to have a cigarette. Some situations in your life might tempt you into wanting a cigarette.  Never have "just one" cigarette. It leads to wanting another and another. Remind yourself of your decision to quit.  Change habits associated with smoking. If you smoked while driving or when feeling stressed, try other activities to replace smoking. Stand up when drinking your coffee. Brush your teeth after eating. Sit in a different chair when you read the paper. Avoid alcohol while trying to quit, and try to drink fewer caffeinated beverages. Alcohol and caffeine may urge you to  smoke.  Avoid foods and drinks that can trigger a desire to smoke, such as sugary or spicy foods and alcohol.  Ask people who smoke not to smoke around you.  Have something planned to do right after eating or having a cup of coffee. For example, plan to take a walk or exercise.  Try a relaxation exercise to calm you down and decrease your stress. Remember, you may be tense and nervous for the first 2 weeks after you quit, but this will pass.  Find new activities to keep your hands busy. Play with a pen, coin, or rubber band. Doodle or draw things on paper.  Brush your teeth right after eating. This will help cut down on the craving for the taste of tobacco after meals. You can also try mouthwash.   Use oral substitutes in place of cigarettes. Try using lemon drops, carrots, cinnamon sticks, or chewing gum. Keep them handy so they are available when you have the urge to smoke.  When you have the urge to smoke, try deep breathing.  Designate your home as a nonsmoking area.  If you are a heavy smoker, ask your health care provider about a prescription for nicotine chewing gum. It can ease your withdrawal from nicotine.  Reward yourself. Set aside the cigarette money you save and buy yourself something nice.  Look for support from others. Join a support group or smoking cessation program. Ask someone at home or at work to help you with your plan   to quit smoking.  Always ask yourself, "Do I need this cigarette or is this just a reflex?" Tell yourself, "Today, I choose not to smoke," or "I do not want to smoke." You are reminding yourself of your decision to quit.  Do not replace cigarette smoking with electronic cigarettes (commonly called e-cigarettes). The safety of e-cigarettes is unknown, and some may contain harmful chemicals.  If you relapse, do not give up! Plan ahead and think about what you will do the next time you get the urge to smoke. HOW WILL I FEEL WHEN I QUIT SMOKING? You  may have symptoms of withdrawal because your body is used to nicotine (the addictive substance in cigarettes). You may crave cigarettes, be irritable, feel very hungry, cough often, get headaches, or have difficulty concentrating. The withdrawal symptoms are only temporary. They are strongest when you first quit but will go away within 10-14 days. When withdrawal symptoms occur, stay in control. Think about your reasons for quitting. Remind yourself that these are signs that your body is healing and getting used to being without cigarettes. Remember that withdrawal symptoms are easier to treat than the major diseases that smoking can cause.  Even after the withdrawal is over, expect periodic urges to smoke. However, these cravings are generally short lived and will go away whether you smoke or not. Do not smoke! WHAT RESOURCES ARE AVAILABLE TO HELP ME QUIT SMOKING? Your health care provider can direct you to community resources or hospitals for support, which may include:  Group support.  Education.  Hypnosis.  Therapy.   This information is not intended to replace advice given to you by your health care provider. Make sure you discuss any questions you have with your health care provider.   Document Released: 11/07/2003 Document Revised: 03/01/2014 Document Reviewed: 07/27/2012 Elsevier Interactive Patient Education 2016 [employer]  

## 2015-09-03 ENCOUNTER — Encounter: Payer: Self-pay | Admitting: Nurse Practitioner

## 2015-09-03 ENCOUNTER — Ambulatory Visit (INDEPENDENT_AMBULATORY_CARE_PROVIDER_SITE_OTHER): Payer: Commercial Managed Care - HMO | Admitting: Nurse Practitioner

## 2015-09-03 VITALS — BP 110/76 | Temp 98.4°F | Ht 64.0 in | Wt 141.1 lb

## 2015-09-03 DIAGNOSIS — K219 Gastro-esophageal reflux disease without esophagitis: Secondary | ICD-10-CM | POA: Diagnosis not present

## 2015-09-03 DIAGNOSIS — J329 Chronic sinusitis, unspecified: Secondary | ICD-10-CM | POA: Diagnosis not present

## 2015-09-03 MED ORDER — AZITHROMYCIN 250 MG PO TABS: ORAL_TABLET | ORAL | Status: DC

## 2015-09-03 MED ORDER — VARENICLINE TARTRATE 1 MG PO TABS: 1.0000 mg | ORAL_TABLET | Freq: Two times a day (BID) | ORAL | Status: DC

## 2015-09-03 MED ORDER — VARENICLINE TARTRATE 0.5 MG X 11 & 1 MG X 42 PO MISC: ORAL | Status: DC

## 2015-09-03 NOTE — Patient Instructions (Signed)
OTC antihistamine Steroid nasal spray

## 2015-09-03 NOTE — Progress Notes (Signed)
Subjective:  Presents for c/o left sided facial area headache. Spells of coughing producing green sputum at times. Fullness in the ears. No fever, sore throat or wheezing. Has had some reflux lately. Caffeine in diet. Increased stress. Had stopped Omeprazole since her GERD had resolved. No abdominal pain. Burning in the throat area at times from reflux. Bowels nl.   Objective:   BP 110/76 mmHg  Temp(Src) 98.4 F (36.9 C) (Oral)  Ht 5\' 4"  (1.626 m)  Wt 141 lb 2 oz (64.014 kg)  BMI 24.21 kg/m2 NAD. Alert, oriented. TMs clear effusion. Pharynx injected with green PND noted. Neck supple with mild anterior adenopathy. Lungs clear. Heart RRR. Abdomen soft, non tender.   Assessment: Rhinosinusitis  Gastroesophageal reflux disease without esophagitis  Plan:  Meds ordered this encounter  Medications  . azithromycin (ZITHROMAX Z-PAK) 250 MG tablet    Sig: Take 2 tablets (500 mg) on  Day 1,  followed by 1 tablet (250 mg) once daily on Days 2 through 5.    Dispense:  6 each    Refill:  0    Order Specific Question:  Supervising Provider    Answer:  Mikey Kirschner [[id number]]  . varenicline (CHANTIX STARTING MONTH PAK) 0.5 MG X 11 & 1 MG X 42 tablet    Sig: Take one 0.5 mg tablet by mouth once daily for 3 days, then increase to one 0.5 mg tablet twice daily for 4 days, then increase to one 1 mg tablet twice daily.    Dispense:  53 tablet    Refill:  0    Order Specific Question:  Supervising Provider    Answer:  Mikey Kirschner [[id number]]  . varenicline (CHANTIX CONTINUING MONTH PAK) 1 MG tablet    Sig: Take 1 tablet (1 mg total) by mouth 2 (two) times daily.    Dispense:  60 tablet    Refill:  1    Order Specific Question:  Supervising Provider    Answer:  Mikey Kirschner [[id number]]   OTC meds as directed. Call back if worsens or persists. Reviewed potential adverse effects of Chantix. DC med and call if any problems.

## 2015-11-12 ENCOUNTER — Other Ambulatory Visit: Payer: Self-pay | Admitting: Dermatology

## 2015-11-12 DIAGNOSIS — C4492 Squamous cell carcinoma of skin, unspecified: Secondary | ICD-10-CM

## 2015-11-12 HISTORY — DX: Squamous cell carcinoma of skin, unspecified: C44.92

## 2016-01-22 ENCOUNTER — Encounter: Payer: Self-pay | Admitting: Family Medicine

## 2016-01-22 ENCOUNTER — Ambulatory Visit (INDEPENDENT_AMBULATORY_CARE_PROVIDER_SITE_OTHER): Payer: Commercial Managed Care - HMO | Admitting: Family Medicine

## 2016-01-22 VITALS — BP 102/66 | Temp 98.7°F | Ht 64.0 in | Wt 143.5 lb

## 2016-01-22 DIAGNOSIS — J329 Chronic sinusitis, unspecified: Secondary | ICD-10-CM | POA: Diagnosis not present

## 2016-01-22 MED ORDER — AZITHROMYCIN 250 MG PO TABS: ORAL_TABLET | ORAL | 0 refills | Status: DC

## 2016-01-22 NOTE — Progress Notes (Signed)
   Subjective:    Patient ID: Madison Aguilar, female    DOB: Jan 21, 1966, 50 y.o.   MRN: [id number]  URI   This is a new problem. The current episode started in the past 7 days. Associated symptoms include coughing, ear pain, headaches, a sore throat and wheezing.   Started sore throat light  Not bad  Then ear pain moving into the chest  Left ear mainly uncomfortable  No fever   Rare use of inhaler   Not mu h wheezy geernally only at night  Sneezing a lot in the morn    Moving into the chest  Patient states no other concerns this visit.  Review of Systems  HENT: Positive for ear pain and sore throat.   Respiratory: Positive for cough and wheezing.   Neurological: Positive for headaches.       Objective:   Physical Exam  Alert, mild malaise. Hydration good Vitals stable. frontal/ maxillary tenderness evident positive nasal congestion. pharynx normal neck supple  lungs clear/no crackles or wheezes. heart regular in rhythm       Assessment & Plan:  Impression rhinosinusitis likely post viral, discussed with patient. plan antibiotics prescribed. Questions answered. Symptomatic care discussed. warning signs discussed. WSL Encouraged to use symptom care medication also, to try to stop smoking

## 2016-02-04 ENCOUNTER — Other Ambulatory Visit: Payer: Self-pay | Admitting: Dermatology

## 2016-03-17 DIAGNOSIS — Z6823 Body mass index (BMI) 23.0-23.9, adult: Secondary | ICD-10-CM | POA: Diagnosis not present

## 2016-03-17 DIAGNOSIS — Z1231 Encounter for screening mammogram for malignant neoplasm of breast: Secondary | ICD-10-CM | POA: Diagnosis not present

## 2016-03-18 ENCOUNTER — Other Ambulatory Visit: Payer: Self-pay | Admitting: Family Medicine

## 2016-05-11 ENCOUNTER — Ambulatory Visit: Payer: Commercial Managed Care - HMO | Admitting: Family Medicine

## 2016-05-17 ENCOUNTER — Ambulatory Visit (HOSPITAL_COMMUNITY)
Admission: RE | Admit: 2016-05-17 | Discharge: 2016-05-17 | Disposition: A | Discharge status: Home / Self Care | Payer: Commercial Managed Care - HMO | Source: Ambulatory Visit | Attending: Family Medicine | Admitting: Family Medicine

## 2016-05-17 ENCOUNTER — Ambulatory Visit (INDEPENDENT_AMBULATORY_CARE_PROVIDER_SITE_OTHER): Payer: Commercial Managed Care - HMO | Admitting: Family Medicine

## 2016-05-17 ENCOUNTER — Encounter: Payer: Self-pay | Admitting: Family Medicine

## 2016-05-17 VITALS — BP 108/70 | Ht 64.0 in | Wt 149.1 lb

## 2016-05-17 DIAGNOSIS — M549 Dorsalgia, unspecified: Secondary | ICD-10-CM

## 2016-05-17 DIAGNOSIS — R7301 Impaired fasting glucose: Secondary | ICD-10-CM

## 2016-05-17 DIAGNOSIS — G8929 Other chronic pain: Secondary | ICD-10-CM | POA: Diagnosis not present

## 2016-05-17 DIAGNOSIS — E785 Hyperlipidemia, unspecified: Secondary | ICD-10-CM

## 2016-05-17 DIAGNOSIS — M545 Low back pain: Secondary | ICD-10-CM | POA: Diagnosis not present

## 2016-05-17 DIAGNOSIS — E039 Hypothyroidism, unspecified: Secondary | ICD-10-CM

## 2016-05-17 MED ORDER — NAPROXEN 500 MG PO TABS: ORAL_TABLET | ORAL | 0 refills | Status: DC

## 2016-05-17 NOTE — Progress Notes (Signed)
   Subjective:    Patient ID: Madison Aguilar, female    DOB: 03-Dec-1965, 51 y.o.   MRN: [id number]  Back Pain  This is a recurrent problem. The current episode started more than 1 month ago. The pain is present in the lumbar spine and thoracic spine. The pain radiates to the left thigh and right thigh. The symptoms are aggravated by sitting. Associated symptoms include numbness. She has tried NSAIDs (ibuprofen, advil) for the symptoms.   Patient would like to have thyroid tested . Claims compliance with thyroid medication. No symptoms of high or low thyroid. Generally does not miss a dose.  Left lumbar pain , low back rad up tp flank  Comes and goes, left leg a little achey   exercise not enough  Got off walking routine, waitni for weather   Hits nt necessarily early in the day ,   Sing out with change in position   Take sibuprofen q o d, takes two otc tabd pr, uses some to calm it down   Gives history of substantial arthritis left hip many years ago. Had an MRI was told this. Even offered surgery she did not take orthopedic surgeon up on this.  Reports ongoing smoking. No cough no hemoptysis. Patient did not take Chantix as prescribed  Review of Systems  Musculoskeletal: Positive for back pain.  Neurological: Positive for numbness.       Objective:   Physical Exam  .Alert and oriented, vitals reviewed and stable, NAD ENT-TM's and ext canals WNL bilat via otoscopic exam Soft palate, tonsils and post pharynx WNL via oropharyngeal exam Neck-symmetric, no masses; thyroid nonpalpable and nontender Pulmonary-no tachypnea or accessory muscle use; Clear without wheezes via auscultation Card--no abnrml murmurs, rhythm reg and rate WNL Carotid pulses symmetric, without bruits Left lateral hip tenderness to deep palpation left lumbar tenderness deep palpation negative straight leg raise. Thyroid not palpable      Assessment & Plan:  Impression 1 hypothyroidism status uncertain  discuss #2 subacute left lumbar strain discussed with need for regular exercise and stretching discussed. #3 left lateral thoracic pain with chronic smoking patient worried about this discussed #4 chronic smoker discussed #5 trochanteric hip arthritis discussed plan anti-inflammatory medicine when necessary. Regular exercise discussed. Appropriate blood work. Further recommendations based results. Appropriate x-rays. Further recommendations based results.  Greater than 50% of this 25 minute face to face visit was spent in counseling and discussion and coordination of care regarding the above diagnosis/diagnosies

## 2016-07-15 DIAGNOSIS — E039 Hypothyroidism, unspecified: Secondary | ICD-10-CM | POA: Diagnosis not present

## 2016-07-15 DIAGNOSIS — E785 Hyperlipidemia, unspecified: Secondary | ICD-10-CM | POA: Diagnosis not present

## 2016-07-15 DIAGNOSIS — R7301 Impaired fasting glucose: Secondary | ICD-10-CM | POA: Diagnosis not present

## 2016-07-16 LAB — TSH: TSH: 0.492 u[IU]/mL (ref 0.450–4.500)

## 2016-07-16 LAB — GLUCOSE, RANDOM: Glucose: 93 mg/dL (ref 65–99)

## 2016-07-16 LAB — LIPID PANEL
Chol/HDL Ratio: 3 ratio (ref 0.0–4.4)
Cholesterol, Total: 199 mg/dL (ref 100–199)
HDL: 66 mg/dL (ref >39)
LDL Calculated: 121 mg/dL — ABNORMAL HIGH (ref 0–99)
Triglycerides: 58 mg/dL (ref 0–149)
VLDL Cholesterol Cal: 12 mg/dL (ref 5–40)

## 2016-07-20 ENCOUNTER — Encounter: Payer: Self-pay | Admitting: Family Medicine

## 2016-09-14 ENCOUNTER — Other Ambulatory Visit: Payer: Self-pay | Admitting: Family Medicine

## 2016-09-16 DIAGNOSIS — L723 Sebaceous cyst: Secondary | ICD-10-CM | POA: Diagnosis not present

## 2016-12-11 ENCOUNTER — Other Ambulatory Visit: Payer: Self-pay | Admitting: Family Medicine

## 2017-01-16 ENCOUNTER — Other Ambulatory Visit: Payer: Self-pay | Admitting: Family Medicine

## 2017-01-19 ENCOUNTER — Ambulatory Visit: Payer: Commercial Managed Care - HMO | Admitting: Nurse Practitioner

## 2017-01-21 ENCOUNTER — Encounter: Payer: Self-pay | Admitting: Family Medicine

## 2017-01-21 ENCOUNTER — Ambulatory Visit: Payer: 59 | Admitting: Family Medicine

## 2017-01-21 VITALS — BP 120/80 | Ht 64.0 in | Wt 149.0 lb

## 2017-01-21 DIAGNOSIS — M7062 Trochanteric bursitis, left hip: Secondary | ICD-10-CM

## 2017-01-21 DIAGNOSIS — M7711 Lateral epicondylitis, right elbow: Secondary | ICD-10-CM | POA: Diagnosis not present

## 2017-01-21 MED ORDER — ETODOLAC 400 MG PO TABS: 400.0000 mg | ORAL_TABLET | Freq: Two times a day (BID) | ORAL | 1 refills | Status: DC

## 2017-01-21 NOTE — Progress Notes (Signed)
   Subjective:    Patient ID: Madison Aguilar, female    DOB: Jan 01, 1966, 51 y.o.   MRN: [id number]  HPI Patient is here today with complaints of hip pain that has been reoccurring off and on for years and elbow pain that has ben present for around two months now.  Pain fairly severe in the right arm''over the last three months  Deep ache in the elbow, painful to pick things u  Just picking up stuff bece quit e a painful   Right handed   stRTED WITH majr ifting of clothers and then it kicked in   Hips hurting . Left greater than right  g mo hafd osteoporosis   Building up ovdr the yrs,  Pt exercising walking  Feels it in the hip almost instntly   Trouble with long drive    No major injry in the ast    Review of Systems No headache, no major weight loss or weight gain, no chest pain no back pain abdominal pain no change in bowel habits complete ROS otherwise negative      Objective:   Physical Exam  Alert and oriented, vitals reviewed and stable, NAD ENT-TM's and ext canals WNL bilat via otoscopic exam Soft palate, tonsils and post pharynx WNL via oropharyngeal exam Neck-symmetric, no masses; thyroid nonpalpable and nontender Pulmonary-no tachypnea or accessory muscle use; Clear without wheezes via auscultation Card--no abnrml murmurs, rhythm reg and rate WNL Carotid pulses symmetric, without bruits Right lateral epicondyle distinct tenderness shoulder good range of motion wrist normal elbow joint otherwise normal grip somewhat impacted  Hip good range of motion positive lateral trochanteric tenderness      Assessment & Plan:  Impression 1 lateral epicondylitis discussed at great length.  Forearms appear trial of the inflammatory medications  2.  Hip trochanteric bursitis.  Discussed him.  On for years.  Likely will need x-rays and injections.  Will work orthopedic referral rationale discussed  Greater than 50% of this 25 minute face to face visit was spent in  counseling and discussion and coordination of care regarding the above diagnosis/diagnosies

## 2017-01-26 ENCOUNTER — Encounter: Payer: Self-pay | Admitting: Family Medicine

## 2017-02-07 DIAGNOSIS — M545 Low back pain: Secondary | ICD-10-CM | POA: Diagnosis not present

## 2017-02-07 DIAGNOSIS — M25551 Pain in right hip: Secondary | ICD-10-CM | POA: Diagnosis not present

## 2017-02-07 DIAGNOSIS — M25552 Pain in left hip: Secondary | ICD-10-CM | POA: Diagnosis not present

## 2017-02-17 ENCOUNTER — Other Ambulatory Visit: Payer: Self-pay | Admitting: Family Medicine

## 2017-03-23 DIAGNOSIS — Z124 Encounter for screening for malignant neoplasm of cervix: Secondary | ICD-10-CM | POA: Diagnosis not present

## 2017-03-23 DIAGNOSIS — Z01419 Encounter for gynecological examination (general) (routine) without abnormal findings: Secondary | ICD-10-CM | POA: Diagnosis not present

## 2017-04-07 DIAGNOSIS — M25551 Pain in right hip: Secondary | ICD-10-CM | POA: Diagnosis not present

## 2017-06-14 DIAGNOSIS — M25512 Pain in left shoulder: Secondary | ICD-10-CM | POA: Diagnosis not present

## 2017-06-27 ENCOUNTER — Telehealth (HOSPITAL_COMMUNITY): Payer: Self-pay | Admitting: Family Medicine

## 2017-06-27 NOTE — Telephone Encounter (Signed)
06/27/17  I left patient a message to let her know about her insurance benefits before her Eval appt.

## 2017-07-06 ENCOUNTER — Ambulatory Visit (HOSPITAL_COMMUNITY): Payer: 59 | Attending: Orthopedic Surgery | Admitting: Occupational Therapy

## 2017-07-06 ENCOUNTER — Other Ambulatory Visit: Payer: Self-pay

## 2017-07-06 DIAGNOSIS — M25612 Stiffness of left shoulder, not elsewhere classified: Secondary | ICD-10-CM | POA: Diagnosis not present

## 2017-07-06 DIAGNOSIS — M25512 Pain in left shoulder: Secondary | ICD-10-CM | POA: Diagnosis not present

## 2017-07-06 DIAGNOSIS — R29898 Other symptoms and signs involving the musculoskeletal system: Secondary | ICD-10-CM | POA: Diagnosis not present

## 2017-07-06 NOTE — Patient Instructions (Signed)

## 2017-07-07 NOTE — Therapy (Signed)
[place], [place], [address] Phone: [phone]   Fax:  [phone]  Occupational Therapy Evaluation  Patient Details  Name: Madison Aguilar MRN: [id number] Date of Birth: Sep 15, 1965 Referring Provider: Dr. Earlie Server   Encounter Date: 07/06/2017  OT End of Session - 07/07/17 0936    Visit Number  1    Number of Visits  8    Date for OT Re-Evaluation  08/06/17    Authorization Type  [employer]    Authorization Time Period  60 visit limit; $17 copay    Authorization - Visit Number  1    Authorization - Number of Visits  57    OT Start Time  9892    OT Stop Time  1738    OT Time Calculation (min)  48 min    Activity Tolerance  Patient tolerated treatment well    Behavior During Therapy  [facility] for tasks assessed/performed       Past Medical History:  Diagnosis Date  . GERD (gastroesophageal reflux disease)   . Hypothyroid   . Migraine headache   . PONV (postoperative nausea and vomiting)     Past Surgical History:  Procedure Laterality Date  . Bilateral great toe surgery    . DILITATION & CURRETTAGE/HYSTROSCOPY WITH NOVASURE ABLATION  03/03/2012   Procedure: DILATATION & CURETTAGE/HYSTEROSCOPY WITH NOVASURE ABLATION;  Surgeon: Sharene Butters, MD;  Location: [facility];  Service: Gynecology;  Laterality: N/A;  . none      There were no vitals filed for this visit.  Subjective Assessment - 07/07/17 0932    Subjective   S: It just started hurting, I don't remember doing anything to it.     Pertinent History  Pt is a 52 y/o female presenting with left shoulder pain present for approximately 2-3 months with no obvious cause (trauma, injury, etc.). Pt notes she has been taking anti-inflammatory medication for her hip pain which has not helped her shoulder. Pt was referred to occupational therapy for evaluation and treatment by Dr. Earlie Server.     Special Tests  FOTO to be completed at next session    Patient  Stated Goals  To have less pain and more mobility of my arm.     Currently in Pain?  Yes    Pain Score  5     Pain Location  Shoulder    Pain Orientation  Left    Pain Descriptors / Indicators  Aching;Sore    Pain Type  Acute pain    Pain Radiating Towards  n/a    Pain Onset  More than a month ago    Pain Frequency  Constant    Aggravating Factors   certain movements    Pain Relieving Factors  rest, heat (shower, electric blanket)    Effect of Pain on Daily Activities  Mod effect on B/IADL completion    Multiple Pain Sites  No        [facility] OT Assessment - 07/06/17 1651      Assessment   Medical Diagnosis  Left shoulder bursitis    Referring Provider  Dr. Earlie Server    Onset Date/Surgical Date  04/08/17    Hand Dominance  Right    Prior Therapy  None      Precautions   Precautions  None      Balance Screen   Has the patient fallen in the past 6 months  No    Has  the patient had a decrease in activity level because of a fear of falling?   No    Is the patient reluctant to leave their home because of a fear of falling?   No      Prior Function   Level of Independence  Independent    Vocation  Full time employment    Primary school teacher, computer work    Leisure  Management consultant, gardening       ADL   ADL comments  Pt is having difficulty with dressing, reaching behind back, taking bra off, bathing, sleeping      Written Expression   Dominant Hand  Right      Cognition   Overall Cognitive Status  Within Functional Limits for tasks assessed      Observation/Other Assessments   Focus on Therapeutic Outcomes (FOTO)   complete next session      ROM / Strength   AROM / PROM / Strength  AROM;PROM;Strength      Palpation   Palpation comment  Pt with mod-max fascial restrictions along left upper arm, deltoid, trapezius, and scapularis regions      AROM   Overall AROM Comments  Assessed seated, er/IR adducted    AROM  Assessment Site  Shoulder    Right/Left Shoulder  Left    Left Shoulder Flexion  115 Degrees    Left Shoulder ABduction  120 Degrees    Left Shoulder Internal Rotation  90 Degrees    Left Shoulder External Rotation  42 Degrees      PROM   Overall PROM Comments  Assessed supine, er/IR adducted    PROM Assessment Site  Shoulder    Right/Left Shoulder  Left    Left Shoulder Flexion  116 Degrees    Left Shoulder ABduction  120 Degrees    Left Shoulder Internal Rotation  90 Degrees    Left Shoulder External Rotation  54 Degrees      Strength   Overall Strength Comments  Assessed seated, er/IR adducted    Strength Assessment Site  Shoulder    Right/Left Shoulder  Left    Left Shoulder Flexion  4-/5    Left Shoulder ABduction  4-/5    Left Shoulder Internal Rotation  4/5    Left Shoulder External Rotation  3/5                      OT Education - 07/07/17 0935    Education provided  Yes    Education Details  shoulder stretches    Person(s) Educated  Patient    Methods  Explanation;Demonstration;Handout    Comprehension  Verbalized understanding;Returned demonstration       OT Short Term Goals - 07/07/17 0941      OT SHORT TERM GOAL #1   Title  Pt will be provided with and educated on HEP to improve LUE mobility required for functional task completion.     Time  4    Period  Weeks    Status  New    Target Date  08/06/17      OT SHORT TERM GOAL #2   Title  Pt will decrease pain in LUE to 3/10 or less to improve ability to sleep on left side.     Time  4    Period  Weeks    Status  New      OT SHORT TERM GOAL #3   Title  Pt will  decrease fascial restrictions in LUE to minimal amounts or less to improve mobility required for functional reaching tasks.     Time  4    Period  Weeks    Status  New      OT SHORT TERM GOAL #4   Title  Pt will increase LUE ROM to [facility] to improve ability to reach behind back to fasten/unfasten bra.     Time  4    Period  Weeks     Status  New      OT SHORT TERM GOAL #5   Title  Pt will improve LUE strength to 4+/5 or greater to improve ability to plant flowers and complete gardening tasks.     Time  4    Period  Weeks    Status  New               Plan - 07/07/17 0936    Clinical Impression Statement  A: Pt is a 52 y/o female presenting with left shoulder pain limiting her ability to complete B/IADLs at her highest level of functioning. During evaluation pt tender with manual palpation, some guarding during passive ROM measuring. Pt educated on evaluation findings and therapy expectations, provided with HEP.     Occupational Profile and client history currently impacting functional performance  Pt is independent at baseline and is motivated to return to her highest level of functioning.     Occupational performance deficits (Please refer to evaluation for details):  ADL's;IADL's;Rest and Sleep;Work;Leisure    Rehab Potential  Good    OT Frequency  2x / week    OT Duration  4 weeks    OT Treatment/Interventions  Self-care/ADL training;Therapeutic exercise;Ultrasound;Manual Therapy;Therapeutic activities;Cryotherapy;Electrical Stimulation;Moist Heat;Passive range of motion;Patient/family education    Plan  P: Pt will benefit from skilled OT services to decrease pain and fascial restrictions, increase ROM, strength, and functional use of LUE as non-dominant. Treatment plan: myofascial release, manual techniques, P/ROM, A/ROM, general LUE strengthening, scapular mobility and strengthening/stabilization, modalities prn    Clinical Decision Making  Limited treatment options, no task modification necessary    OT Home Exercise Plan  5/15: shoulder stretches    Consulted and Agree with Plan of Care  Patient       Patient will benefit from skilled therapeutic intervention in order to improve the following deficits and impairments:  Decreased activity tolerance, Decreased strength, Impaired flexibility, Decreased range  of motion, Pain, Increased fascial restrictions, Impaired UE functional use  Visit Diagnosis: Acute pain of left shoulder  Stiffness of left shoulder, not elsewhere classified  Other symptoms and signs involving the musculoskeletal system    Problem List Patient Active Problem List   Diagnosis Date Noted  . Plantar fasciitis 10/02/2012  . Unspecified hypothyroidism 10/02/2012  . Left lower quadrant pain 09/22/2010  . Change in bowel function 09/22/2010  . Rectal bleeding 09/22/2010  . GERD (gastroesophageal reflux disease) 09/22/2010  . Esophageal dysphagia 09/22/2010   Guadelupe Sabin, OTR/L  [phone] 07/07/2017, 9:44 AM  [place], [place], [address] Phone: [phone]   Fax:  [phone]  Name: Madison Aguilar MRN: [id number] Date of Birth: 08-20-1965

## 2017-07-13 ENCOUNTER — Telehealth (HOSPITAL_COMMUNITY): Payer: Self-pay | Admitting: Family Medicine

## 2017-07-13 NOTE — Telephone Encounter (Signed)
07/13/17  pt cx and wanted to come in 5/24 but there is nothing available.  I told her that I would add to the end of her schedule

## 2017-07-14 ENCOUNTER — Ambulatory Visit (HOSPITAL_COMMUNITY): Payer: 59 | Admitting: Occupational Therapy

## 2017-07-20 ENCOUNTER — Encounter (HOSPITAL_COMMUNITY): Payer: 59 | Admitting: Occupational Therapy

## 2017-07-26 ENCOUNTER — Ambulatory Visit (HOSPITAL_COMMUNITY): Payer: 59

## 2017-07-26 ENCOUNTER — Telehealth (HOSPITAL_COMMUNITY): Payer: Self-pay

## 2017-07-26 NOTE — Telephone Encounter (Signed)
Called patient regarding no show. Patient apologized and did not realize that she had an appointment as she has just gotten back to work from vacation and forgot. Patient was reminded of next appointment and to call if she's unable to make it.   Ailene Ravel, OTR/L,CBIS  [phone]

## 2017-07-28 ENCOUNTER — Encounter (HOSPITAL_COMMUNITY): Payer: Self-pay

## 2017-07-28 ENCOUNTER — Ambulatory Visit (HOSPITAL_COMMUNITY): Payer: 59 | Attending: Orthopedic Surgery

## 2017-07-28 ENCOUNTER — Other Ambulatory Visit: Payer: Self-pay

## 2017-07-28 DIAGNOSIS — R29898 Other symptoms and signs involving the musculoskeletal system: Secondary | ICD-10-CM

## 2017-07-28 DIAGNOSIS — M25612 Stiffness of left shoulder, not elsewhere classified: Secondary | ICD-10-CM | POA: Diagnosis not present

## 2017-07-28 DIAGNOSIS — M25512 Pain in left shoulder: Secondary | ICD-10-CM | POA: Diagnosis not present

## 2017-07-28 NOTE — Patient Instructions (Signed)
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
## 2017-08-02 ENCOUNTER — Ambulatory Visit (HOSPITAL_COMMUNITY): Payer: 59 | Admitting: Occupational Therapy

## 2017-08-02 ENCOUNTER — Telehealth (HOSPITAL_COMMUNITY): Payer: Self-pay | Admitting: Occupational Therapy

## 2017-08-02 NOTE — Telephone Encounter (Signed)
Pt called to inquire about attending appointment today. Pt's pain has been hovering around a 7/10 describing it as constant soreness, ongoing since Thursday. Pt is concerned about worsening any injury or tear she may have, as she has not had MRI yet. Pt did call MD today and is waiting on a call back. Discussed options with pt, opting to cancel for today and apply heat tonight and complete gentle stretching over next 2 days, no strenuous work or exercises. If feeling improved on Thursday to come for therapy, if not call office and we'll discuss again.   Guadelupe Sabin, OTR/L  [phone] 08/02/2017

## 2017-08-04 ENCOUNTER — Ambulatory Visit (HOSPITAL_COMMUNITY): Payer: 59

## 2017-08-04 ENCOUNTER — Telehealth (HOSPITAL_COMMUNITY): Payer: Self-pay

## 2017-08-04 NOTE — Telephone Encounter (Signed)
Called patient regarding no show. Patient reports that she has a meeting that ran over and apologized for missing appointment. Patient was reminded of next appointment and to call if she is unable to attend. This is patient's 2nd no show.    Ailene Ravel, OTR/L,CBIS  [phone]

## 2017-08-09 ENCOUNTER — Ambulatory Visit (HOSPITAL_COMMUNITY): Payer: 59 | Admitting: Occupational Therapy

## 2017-08-09 ENCOUNTER — Encounter (HOSPITAL_COMMUNITY): Payer: Self-pay | Admitting: Occupational Therapy

## 2017-08-09 DIAGNOSIS — M25512 Pain in left shoulder: Secondary | ICD-10-CM | POA: Diagnosis not present

## 2017-08-09 DIAGNOSIS — R29898 Other symptoms and signs involving the musculoskeletal system: Secondary | ICD-10-CM

## 2017-08-09 DIAGNOSIS — M25612 Stiffness of left shoulder, not elsewhere classified: Secondary | ICD-10-CM

## 2017-08-09 NOTE — Therapy (Signed)
[place], [place], [address] Phone: [phone]   Fax:  [phone]  Occupational Therapy Treatment  Patient Details  Name: Madison Aguilar MRN: [id number] Date of Birth: Apr 11, 1965 Referring Provider: Dr. Earlie Server   Encounter Date: 08/09/2017  OT End of Session - 08/09/17 1748    Visit Number  3    Number of Visits  8    Date for OT Re-Evaluation  08/06/17    Authorization Type  UHC    Authorization Time Period  60 visit limit; $17 copay    Authorization - Visit Number  3    Authorization - Number of Visits  61    OT Start Time  6606    OT Stop Time  1725    OT Time Calculation (min)  37 min    Activity Tolerance  Patient tolerated treatment well    Behavior During Therapy  [facility] for tasks assessed/performed       Past Medical History:  Diagnosis Date  . GERD (gastroesophageal reflux disease)   . Hypothyroid   . Migraine headache   . PONV (postoperative nausea and vomiting)     Past Surgical History:  Procedure Laterality Date  . Bilateral great toe surgery    . DILITATION & CURRETTAGE/HYSTROSCOPY WITH NOVASURE ABLATION  03/03/2012   Procedure: DILATATION & CURETTAGE/HYSTEROSCOPY WITH NOVASURE ABLATION;  Surgeon: Sharene Butters, MD;  Location: [facility];  Service: Gynecology;  Laterality: N/A;  . none      There were no vitals filed for this visit.  Subjective Assessment - 08/09/17 1647    Subjective   S: It's hurting pretty bad today.     Currently in Pain?  Yes    Pain Score  7     Pain Location  Shoulder    Pain Orientation  Left    Pain Descriptors / Indicators  Aching;Sore    Pain Type  Acute pain    Pain Radiating Towards  n/a    Pain Onset  More than a month ago    Pain Frequency  Constant    Aggravating Factors   certain movement    Pain Relieving Factors  rest, heat, shower, electric blanket    Effect of Pain on Daily Activities  mod effect on B/IADL completion    Multiple Pain  Sites  No         [facility] OT Assessment - 08/09/17 1647      Assessment   Medical Diagnosis  Left shoulder bursitis      Precautions   Precautions  None      Palpation   Palpation comment  Pt with mod-max fascial restrictions along left upper arm, deltoid, trapezius, and scapularis regions      AROM   Overall AROM Comments  Assessed seated, er/IR adducted    AROM Assessment Site  Shoulder    Right/Left Shoulder  Left    Left Shoulder Flexion  126 Degrees 115 previous    Left Shoulder ABduction  96 Degrees 120 previous    Left Shoulder Internal Rotation  90 Degrees same as previous    Left Shoulder External Rotation  57 Degrees 42 previous      PROM   Overall PROM Comments  Assessed supine, er/IR adducted    PROM Assessment Site  Shoulder    Right/Left Shoulder  Left    Left Shoulder Flexion  138 Degrees 116 previous    Left Shoulder ABduction  130  Degrees 120 previous    Left Shoulder Internal Rotation  90 Degrees same as previous    Left Shoulder External Rotation  64 Degrees 54 previous      Strength   Overall Strength Comments  Assessed seated, er/IR adducted    Strength Assessment Site  Shoulder    Right/Left Shoulder  Left    Left Shoulder Flexion  4-/5 same as previous    Left Shoulder ABduction  4-/5 same as previous    Left Shoulder Internal Rotation  4/5 same as previous    Left Shoulder External Rotation  3/5 same as previous               OT Treatments/Exercises (OP) - 08/09/17 1652      Exercises   Exercises  Shoulder      Shoulder Exercises: Supine   Protraction  PROM;AROM;10 reps    Horizontal ABduction  PROM;AROM;10 reps    External Rotation  PROM;AROM;10 reps    Internal Rotation  PROM;AROM;10 reps    Flexion  PROM;AROM;10 reps    ABduction  PROM;AROM;10 reps      Modalities   Modalities  Electrical Stimulation      Electrical Stimulation   Electrical Stimulation Location  left shoulder    Electrical Stimulation Action  inferential      Electrical Stimulation Parameters  5.8 CV    Electrical Stimulation Goals  Pain      Manual Therapy   Manual Therapy  Soft tissue mobilization    Manual therapy comments  Manual therapy completed prior to exercises.    Soft tissue mobilization  Soft tissue mobilization and manual stretching completed to left upper arm, trapezius, and scapularis region to decrease fascial restrictions and increase joint mobility in a pain free zone.                OT Short Term Goals - 07/28/17 1714      OT SHORT TERM GOAL #1   Title  Pt will be provided with and educated on HEP to improve LUE mobility required for functional task completion.     Time  4    Period  Weeks    Status  On-going      OT SHORT TERM GOAL #2   Title  Pt will decrease pain in LUE to 3/10 or less to improve ability to sleep on left side.     Time  4    Period  Weeks    Status  On-going      OT SHORT TERM GOAL #3   Title  Pt will decrease fascial restrictions in LUE to minimal amounts or less to improve mobility required for functional reaching tasks.     Time  4    Period  Weeks    Status  On-going      OT SHORT TERM GOAL #4   Title  Pt will increase LUE ROM to [facility] to improve ability to reach behind back to fasten/unfasten bra.     Time  4    Period  Weeks    Status  On-going      OT SHORT TERM GOAL #5   Title  Pt will improve LUE strength to 4+/5 or greater to improve ability to plant flowers and complete gardening tasks.     Time  4    Period  Weeks    Status  On-going               Plan -  08/09/17 1720    Clinical Impression Statement  A: Reassessment completed this session, pt has attended 3 sessions including today, reports she is completing her HEP daily. Pt continues to have increased pain in left shoulder, slightly different than initial pain on evaluation, reports she called MD and is going to be scheduled for an MRI. No goals have been met thus far, discussed pt's symptoms and  functional use of LUE. Pt is agreeable to hold therapy and continue with gentle HEP until she has MRI. ES applied at end of session for pain relief.     Plan  P: Hold therapy until pt has MRI       Patient will benefit from skilled therapeutic intervention in order to improve the following deficits and impairments:  Decreased activity tolerance, Decreased strength, Impaired flexibility, Decreased range of motion, Pain, Increased fascial restrictions, Impaired UE functional use  Visit Diagnosis: Acute pain of left shoulder  Stiffness of left shoulder, not elsewhere classified  Other symptoms and signs involving the musculoskeletal system    Problem List Patient Active Problem List   Diagnosis Date Noted  . Plantar fasciitis 10/02/2012  . Unspecified hypothyroidism 10/02/2012  . Left lower quadrant pain 09/22/2010  . Change in bowel function 09/22/2010  . Rectal bleeding 09/22/2010  . GERD (gastroesophageal reflux disease) 09/22/2010  . Esophageal dysphagia 09/22/2010   Guadelupe Sabin, OTR/L  [phone] 08/09/2017, 5:48 PM  [place], [place], [address] Phone: [phone]   Fax:  [phone]  Name: Madison Aguilar MRN: [id number] Date of Birth: Aug 03, 1965

## 2017-08-11 ENCOUNTER — Encounter (HOSPITAL_COMMUNITY): Payer: 59 | Admitting: Occupational Therapy

## 2017-08-11 ENCOUNTER — Encounter (HOSPITAL_COMMUNITY): Payer: 59

## 2017-08-15 DIAGNOSIS — M25512 Pain in left shoulder: Secondary | ICD-10-CM | POA: Diagnosis not present

## 2017-08-16 ENCOUNTER — Encounter (HOSPITAL_COMMUNITY): Payer: 59 | Admitting: Occupational Therapy

## 2017-08-17 ENCOUNTER — Other Ambulatory Visit (HOSPITAL_COMMUNITY): Payer: Self-pay | Admitting: Family Medicine

## 2017-08-17 NOTE — Telephone Encounter (Signed)
Call pt, needs chronic prob o v, lip liv m7 tsh before, when done go aheaf and refill enough to last til that visit

## 2017-08-18 DIAGNOSIS — M25512 Pain in left shoulder: Secondary | ICD-10-CM | POA: Diagnosis not present

## 2017-08-19 ENCOUNTER — Other Ambulatory Visit: Payer: Self-pay | Admitting: Family Medicine

## 2017-08-19 DIAGNOSIS — Z1329 Encounter for screening for other suspected endocrine disorder: Secondary | ICD-10-CM

## 2017-08-19 DIAGNOSIS — E039 Hypothyroidism, unspecified: Secondary | ICD-10-CM

## 2017-08-19 DIAGNOSIS — E785 Hyperlipidemia, unspecified: Secondary | ICD-10-CM

## 2017-08-20 ENCOUNTER — Other Ambulatory Visit (HOSPITAL_COMMUNITY): Payer: Self-pay | Admitting: Family Medicine

## 2017-08-30 ENCOUNTER — Encounter: Payer: Self-pay | Admitting: Family Medicine

## 2017-08-30 ENCOUNTER — Ambulatory Visit: Payer: 59 | Admitting: Family Medicine

## 2017-08-30 VITALS — BP 84/62 | Ht 64.0 in | Wt 150.0 lb

## 2017-08-30 DIAGNOSIS — R7301 Impaired fasting glucose: Secondary | ICD-10-CM

## 2017-08-30 DIAGNOSIS — E785 Hyperlipidemia, unspecified: Secondary | ICD-10-CM

## 2017-08-30 DIAGNOSIS — K219 Gastro-esophageal reflux disease without esophagitis: Secondary | ICD-10-CM | POA: Diagnosis not present

## 2017-08-30 DIAGNOSIS — E039 Hypothyroidism, unspecified: Secondary | ICD-10-CM

## 2017-08-30 MED ORDER — LEVOTHYROXINE SODIUM 88 MCG PO TABS: 88.0000 ug | ORAL_TABLET | Freq: Every day | ORAL | 5 refills | Status: DC

## 2017-08-30 MED ORDER — OMEPRAZOLE 20 MG PO CPDR
20.0000 mg | DELAYED_RELEASE_CAPSULE | Freq: Every day | ORAL | 1 refills | Status: DC
Start: 2017-08-30 — End: 2018-02-05

## 2017-08-30 NOTE — Progress Notes (Signed)
   Subjective:    Patient ID: Madison Aguilar, female    DOB: [birth year], 52 y.o.   MRN: [id number] Patient arrives with numerous concerns HPI  Patient is here today to follow up on her chronic health issues.Was on Diclofenac 75 mg once daily and it helped with her arthritis,but she came off of it as she was scared to take it as he told her she would need blood wor   Results for orders placed or performed in visit on 05/17/16  TSH  Result Value Ref Range   TSH 0.492 0.450 - 4.500 uIU/mL  Lipid panel  Result Value Ref Range   Cholesterol, Total 199 100 - 199 mg/dL   Triglycerides 58 0 - 149 mg/dL   HDL 66 >39 mg/dL   VLDL Cholesterol Cal 12 5 - 40 mg/dL   LDL Calculated 121 (H) 0 - 99 mg/dL   Chol/HDL Ratio 3.0 0.0 - 4.4 ratio  Glucose  Result Value Ref Range   Glucose 93 65 - 99 mg/dL   Fatigued at times  Sees dr caffrey reg for arthrits and joint issues  Got a corisone shot in lift shoulder, injec helped  Doing phys therapy [;us injec and has helped   k.Hard time hearing bp please check behind me.  Right hip arthritis had mri done   Diclofenac helped a lot, and it worked , but was told not to exceed three months   Thyroid faithfully does not mis a dose   bw was ordered did not get it   A  Smoking five cigarettes per day  Started young early with the smoking  Usually ten per dya          Review of Systems No headache, no major weight loss or weight gain, no chest pain no back pain abdominal pain no change in bowel habits complete ROS otherwise negative     Objective:   Physical Exam Alert and oriented, vitals reviewed and stable, NAD ENT-TM's and ext canals WNL bilat via otoscopic exam Soft palate, tonsils and post pharynx WNL via oropharyngeal exam Neck-symmetric, no masses; thyroid nonpalpable and nontender Pulmonary-no tachypnea or accessory muscle use; Clear without wheezes via auscultation Card--no abnrml murmurs, rhythm reg and rate WNL Carotid  pulses symmetric, without bruits        Assessment & Plan:  #1 hypothyroidism.  Blood work ordered.  Patient unable to do as of yet.  States will get it done soon.  Continue same treatment pending results  2.  Right hip arthritis.  Chronic.  Is even had MRI versus orthopedics.  Anti-inflammatory take the patient appropriately concerned about long-term intake of NSAIDs.  Discussed.  Recommend intermittent use for flares and Tylenol  3.  Chronic smoking.  Discussed.  Patient work on cutting it down.  4.  Reflux clinically stable discussed  5.  History of elevated sugar.  Current status uncertain await blood work  Diet exercise discussed follow-up in 6 months  Greater than 50% of this 25 minute face to face visit was spent in counseling and discussion and coordination of care regarding the above diagnosis/diagnosies

## 2017-09-13 DIAGNOSIS — Z1329 Encounter for screening for other suspected endocrine disorder: Secondary | ICD-10-CM | POA: Diagnosis not present

## 2017-09-13 DIAGNOSIS — E039 Hypothyroidism, unspecified: Secondary | ICD-10-CM | POA: Diagnosis not present

## 2017-09-13 DIAGNOSIS — E785 Hyperlipidemia, unspecified: Secondary | ICD-10-CM | POA: Diagnosis not present

## 2017-09-14 LAB — HEPATIC FUNCTION PANEL
ALBUMIN: 4.5 g/dL (ref 3.5–5.5)
ALK PHOS: 57 IU/L (ref 39–117)
ALT: 11 IU/L (ref 0–32)
AST: 11 IU/L (ref 0–40)
Bilirubin Total: 0.3 mg/dL (ref 0.0–1.2)
Bilirubin, Direct: 0.08 mg/dL (ref 0.00–0.40)
Total Protein: 6.9 g/dL (ref 6.0–8.5)

## 2017-09-14 LAB — BASIC METABOLIC PANEL
BUN / CREAT RATIO: 8 — AB (ref 9–23)
BUN: 7 mg/dL (ref 6–24)
CO2: 25 mmol/L (ref 20–29)
CREATININE: 0.86 mg/dL (ref 0.57–1.00)
Calcium: 9.6 mg/dL (ref 8.7–10.2)
Chloride: 103 mmol/L (ref 96–106)
GFR calc Af Amer: 89 mL/min/{1.73_m2} (ref >59)
GFR, EST NON AFRICAN AMERICAN: 77 mL/min/{1.73_m2} (ref >59)
Glucose: 84 mg/dL (ref 65–99)
Potassium: 4.2 mmol/L (ref 3.5–5.2)
SODIUM: 143 mmol/L (ref 134–144)

## 2017-09-14 LAB — LIPID PANEL
CHOLESTEROL TOTAL: 223 mg/dL — AB (ref 100–199)
Chol/HDL Ratio: 3.3 ratio (ref 0.0–4.4)
HDL: 68 mg/dL (ref >39)
LDL Calculated: 142 mg/dL — ABNORMAL HIGH (ref 0–99)
TRIGLYCERIDES: 66 mg/dL (ref 0–149)
VLDL Cholesterol Cal: 13 mg/dL (ref 5–40)

## 2017-09-14 LAB — TSH: TSH: 0.277 u[IU]/mL — ABNORMAL LOW (ref 0.450–4.500)

## 2017-09-15 ENCOUNTER — Other Ambulatory Visit: Payer: Self-pay

## 2017-09-15 MED ORDER — LEVOTHYROXINE SODIUM 75 MCG PO TABS: 75.0000 ug | ORAL_TABLET | Freq: Every day | ORAL | 11 refills | Status: DC

## 2017-09-26 ENCOUNTER — Encounter: Payer: Self-pay | Admitting: Family Medicine

## 2017-09-26 ENCOUNTER — Ambulatory Visit: Payer: 59 | Admitting: Family Medicine

## 2017-09-26 VITALS — BP 110/72 | Temp 98.0°F | Ht 64.0 in | Wt 149.1 lb

## 2017-09-26 DIAGNOSIS — J310 Chronic rhinitis: Secondary | ICD-10-CM

## 2017-09-26 DIAGNOSIS — J329 Chronic sinusitis, unspecified: Secondary | ICD-10-CM | POA: Diagnosis not present

## 2017-09-26 MED ORDER — AMOXICILLIN-POT CLAVULANATE 875-125 MG PO TABS: ORAL_TABLET | ORAL | 0 refills | Status: DC

## 2017-09-26 MED ORDER — FLUCONAZOLE 150 MG PO TABS: ORAL_TABLET | ORAL | 0 refills | Status: DC

## 2017-09-26 NOTE — Progress Notes (Signed)
   Subjective:    Patient ID: Madison Aguilar, female    DOB: February 02, 1966, 52 y.o.   MRN: [id number]  HPI Patient is here today with complaints of a cough,runny nose, bilateral ear pain, headache, sore throat, teeth hurt, ongoing for the last two days.Has taken alkerseltzer which has not helped.  Kicked in couple day s aog  Took nyquil  trhoat painful  Not     Pressure frontal throbbig, worse with cough , extending into the upper jaws      Review of Systems No headache, no major weight loss or weight gain, no chest pain no back pain abdominal pain no change in bowel habits complete ROS otherwise negative     Objective:   Physical Exam  Alert, mild malaise. Hydration good Vitals stable. frontal/ maxillary tenderness evident positive nasal congestion. pharynx normal neck supple  lungs clear/no crackles or wheezes. heart regular in rhythm       Assessment & Plan:  Impression rhinosinusitis likely post viral, discussed with patient. plan antibiotics prescribed. Questions answered. Symptomatic care discussed. warning signs discussed. WSL

## 2017-10-25 DIAGNOSIS — M25511 Pain in right shoulder: Secondary | ICD-10-CM | POA: Diagnosis not present

## 2017-10-26 ENCOUNTER — Encounter (HOSPITAL_COMMUNITY): Payer: Self-pay | Admitting: Specialist

## 2017-10-26 NOTE — Therapy (Signed)
OCCUPATIONAL THERAPY DISCHARGE SUMMARY  Visits from Start of Care: 3  Current functional level related to goals / functional outcomes: As of 08/09/17: [facility] OT Assessment - 08/09/17 1647            Assessment   Medical Diagnosis  Left shoulder bursitis        Precautions   Precautions  None        Palpation   Palpation comment  Pt with mod-max fascial restrictions along left upper arm, deltoid, trapezius, and scapularis regions        AROM   Overall AROM Comments  Assessed seated, er/IR adducted    AROM Assessment Site  Shoulder    Right/Left Shoulder  Left    Left Shoulder Flexion  126 Degrees 115 previous    Left Shoulder ABduction  96 Degrees 120 previous    Left Shoulder Internal Rotation  90 Degrees same as previous    Left Shoulder External Rotation  57 Degrees 42 previous        PROM   Overall PROM Comments  Assessed supine, er/IR adducted    PROM Assessment Site  Shoulder    Right/Left Shoulder  Left    Left Shoulder Flexion  138 Degrees 116 previous    Left Shoulder ABduction  130 Degrees 120 previous    Left Shoulder Internal Rotation  90 Degrees same as previous    Left Shoulder External Rotation  64 Degrees 54 previous        Strength   Overall Strength Comments  Assessed seated, er/IR adducted    Strength Assessment Site  Shoulder    Right/Left Shoulder  Left    Left Shoulder Flexion  4-/5 same as previous    Left Shoulder ABduction  4-/5 same as previous    Left Shoulder Internal Rotation  4/5 same as previous    Left Shoulder External Rotation  3/5 same as previous         Remaining deficits: Reassessment completed on 08/09/17.  Pt continues to have increased pain in left shoulder, slightly different than initial pain on evaluation, reports she called MD and is going to be scheduled for an MRI. No goals have been met thus far, discussed pt's symptoms and functional use of LUE. Pt is agreeable to hold therapy and  continue with gentle HEP until she has MRI. ES applied at end of session for pain relief.       Plan  P: Hold therapy until pt has MRI       Education / Equipment: Stretches for left shoulder.  Plan: Patient agrees to discharge.  Patient goals were not met. Patient is being discharged due to the patient's request.  ?????          Vangie Bicker, [place], OTR/L [phone]

## 2017-11-02 DIAGNOSIS — M25551 Pain in right hip: Secondary | ICD-10-CM | POA: Diagnosis not present

## 2017-12-26 DIAGNOSIS — M25551 Pain in right hip: Secondary | ICD-10-CM | POA: Diagnosis not present

## 2017-12-30 DIAGNOSIS — M25551 Pain in right hip: Secondary | ICD-10-CM | POA: Diagnosis not present

## 2017-12-30 DIAGNOSIS — M545 Low back pain: Secondary | ICD-10-CM | POA: Diagnosis not present

## 2018-01-02 DIAGNOSIS — Z01812 Encounter for preprocedural laboratory examination: Secondary | ICD-10-CM | POA: Diagnosis not present

## 2018-01-02 DIAGNOSIS — M25551 Pain in right hip: Secondary | ICD-10-CM | POA: Diagnosis not present

## 2018-01-04 DIAGNOSIS — M25551 Pain in right hip: Secondary | ICD-10-CM | POA: Diagnosis not present

## 2018-01-09 DIAGNOSIS — M25551 Pain in right hip: Secondary | ICD-10-CM | POA: Diagnosis not present

## 2018-01-12 DIAGNOSIS — M1611 Unilateral primary osteoarthritis, right hip: Secondary | ICD-10-CM | POA: Diagnosis not present

## 2018-01-13 ENCOUNTER — Telehealth: Payer: Self-pay | Admitting: Family Medicine

## 2018-01-13 NOTE — Telephone Encounter (Signed)
[facility] has faxed over an urgent request for right hip replacement surgical clearance. Fax has been placed in Dr. Mariane Duval box in office. Pre op appt is 01/17/18

## 2018-01-17 DIAGNOSIS — M25551 Pain in right hip: Secondary | ICD-10-CM | POA: Diagnosis not present

## 2018-01-24 ENCOUNTER — Ambulatory Visit: Payer: Self-pay | Admitting: Physician Assistant

## 2018-01-24 DIAGNOSIS — M1611 Unilateral primary osteoarthritis, right hip: Secondary | ICD-10-CM

## 2018-01-24 NOTE — H&P (Signed)
TOTAL HIP ADMISSION H&P  Patient is admitted for right total hip arthroplasty.  Subjective:  Chief Complaint: right hip pain  HPI: Madison Aguilar, 52 y.o. female, has a history of pain and functional disability in the right hip(s) due to arthritis and patient has failed non-surgical conservative treatments for greater than 12 weeks to include NSAID's and/or analgesics, corticosteriod injections and activity modification.  Onset of symptoms was gradual starting 3 years ago with gradually worsening course since that time.The patient noted no past surgery on the right hip(s).  Patient currently rates pain in the right hip at 10 out of 10 with activity. Patient has night pain, worsening of pain with activity and weight bearing, trendelenberg gait, pain that interfers with activities of daily living, pain with passive range of motion and joint swelling. Patient has evidence of periarticular osteophytes and joint space narrowing by imaging studies. This condition presents safety issues increasing the risk of falls. There is no current active infection.  Patient Active Problem List   Diagnosis Date Noted  . Primary localized osteoarthritis of right hip 01/24/2018  . Plantar fasciitis 10/02/2012  . Unspecified hypothyroidism 10/02/2012  . Left lower quadrant pain 09/22/2010  . Change in bowel function 09/22/2010  . Rectal bleeding 09/22/2010  . GERD (gastroesophageal reflux disease) 09/22/2010  . Esophageal dysphagia 09/22/2010   Past Medical History:  Diagnosis Date  . GERD (gastroesophageal reflux disease)   . Hypothyroid   . Migraine headache   . PONV (postoperative nausea and vomiting)     Past Surgical History:  Procedure Laterality Date  . Bilateral great toe surgery    . DILITATION & CURRETTAGE/HYSTROSCOPY WITH NOVASURE ABLATION  03/03/2012   Procedure: DILATATION & CURETTAGE/HYSTEROSCOPY WITH NOVASURE ABLATION;  Surgeon: Sharene Butters, MD;  Location: [facility];  Service: Gynecology;   Laterality: N/A;  . none      Current Outpatient Medications  Medication Sig Dispense Refill Last Dose  . amoxicillin-clavulanate (AUGMENTIN) 875-125 MG tablet One p o bid for ten d (Patient not taking: Reported on 01/23/2018) 20 tablet 0 Not Taking at Unknown time  . etodolac (LODINE) 400 MG tablet Take 1 tablet (400 mg total) by mouth 2 (two) times daily. (Patient not taking: Reported on 08/30/2017) 28 tablet 1 Not Taking  . fluconazole (DIFLUCAN) 150 MG tablet One p o three d apart (Patient not taking: Reported on 01/23/2018) 2 tablet 0 Not Taking at Unknown time  . ibuprofen (ADVIL,MOTRIN) 200 MG tablet Take 400 mg by mouth every 6 (six) hours as needed for moderate pain.     Marland Kitchen levothyroxine (SYNTHROID, LEVOTHROID) 75 MCG tablet Take 1 tablet (75 mcg total) by mouth daily. (Patient taking differently: Take 75 mcg by mouth daily before breakfast. ) 30 tablet 11 Taking  . omeprazole (PRILOSEC) 20 MG capsule Take 1 capsule (20 mg total) by mouth daily. (Patient not taking: Reported on 09/26/2017) 90 capsule 1 Not Taking   No current facility-administered medications for this visit.    No Known Allergies  Social History   Tobacco Use  . Smoking status: Light Tobacco Smoker    Packs/day: 0.50    Years: 15.00    Pack years: 7.50    Types: Cigarettes  . Smokeless tobacco: Never Used  Substance Use Topics  . Alcohol use: Yes    Comment: a glass of wine    Family History  Problem Relation Age of Onset  . Diverticulitis Mother        ileostomy  . Diverticulitis  Maternal Grandmother   . Colon cancer Neg Hx   . Liver disease Neg Hx   . Crohn's disease Neg Hx   . Ulcerative colitis Neg Hx      Review of Systems  Musculoskeletal: Positive for joint pain.  Neurological: Positive for headaches.  All other systems reviewed and are negative.   Objective:  Physical Exam  Constitutional: She is oriented to person, place, and time. She appears well-developed and well-nourished. No distress.   HENT:  Head: Normocephalic and atraumatic.  Nose: Nose normal.  Eyes: Pupils are equal, round, and reactive to light. Conjunctivae and EOM are normal.  Neck: Normal range of motion. Neck supple.  Cardiovascular: Normal rate, regular rhythm, normal heart sounds and intact distal pulses.  Respiratory: Effort normal and breath sounds normal. No respiratory distress. She has no wheezes.  GI: Soft. Bowel sounds are normal. She exhibits no distension. There is no tenderness.  Musculoskeletal:       Right hip: She exhibits decreased range of motion, tenderness and bony tenderness.  Lymphadenopathy:    She has no cervical adenopathy.  Neurological: She is alert and oriented to person, place, and time. No cranial nerve deficit.  Skin: Skin is warm and dry. No rash noted. No erythema.  Psychiatric: She has a normal mood and affect. Her behavior is normal.    Vital signs in last 24 hours: @VSRANGES @  Labs:   Estimated body mass index is 25.58 kg/m as calculated from the following:   Height as of 09/26/17: 5\' 4"  (1.626 m).   Weight as of 09/26/17: 67.6 kg.   Imaging Review Plain radiographs demonstrate moderate degenerative joint disease of the right hip(s). The bone quality appears to be good for age and reported activity level.    Preoperative templating of the joint replacement has been completed, documented, and submitted to the Operating Room personnel in order to optimize intra-operative equipment management.     Assessment/Plan:  End stage arthritis, right hip(s)  The patient history, physical examination, clinical judgement of the provider and imaging studies are consistent with end stage degenerative joint disease of the right hip(s) and total hip arthroplasty is deemed medically necessary. The treatment options including medical management, injection therapy, arthroscopy and arthroplasty were discussed at length. The risks and benefits of total hip arthroplasty were presented  and reviewed. The risks due to aseptic loosening, infection, stiffness, dislocation/subluxation,  thromboembolic complications and other imponderables were discussed.  The patient acknowledged the explanation, agreed to proceed with the plan and consent was signed. Patient is being admitted for inpatient treatment for surgery, pain control, PT, OT, prophylactic antibiotics, VTE prophylaxis, progressive ambulation and ADL's and discharge planning.The patient is planning to be discharged home with home health services

## 2018-01-24 NOTE — H&P (View-Only) (Signed)
TOTAL HIP ADMISSION H&P  Patient is admitted for right total hip arthroplasty.  Subjective:  Chief Complaint: right hip pain  HPI: Madison Aguilar, 52 y.o. female, has a history of pain and functional disability in the right hip(s) due to arthritis and patient has failed non-surgical conservative treatments for greater than 12 weeks to include NSAID's and/or analgesics, corticosteriod injections and activity modification.  Onset of symptoms was gradual starting 3 years ago with gradually worsening course since that time.The patient noted no past surgery on the right hip(s).  Patient currently rates pain in the right hip at 10 out of 10 with activity. Patient has night pain, worsening of pain with activity and weight bearing, trendelenberg gait, pain that interfers with activities of daily living, pain with passive range of motion and joint swelling. Patient has evidence of periarticular osteophytes and joint space narrowing by imaging studies. This condition presents safety issues increasing the risk of falls. There is no current active infection.  Patient Active Problem List   Diagnosis Date Noted  . Primary localized osteoarthritis of right hip 01/24/2018  . Plantar fasciitis 10/02/2012  . Unspecified hypothyroidism 10/02/2012  . Left lower quadrant pain 09/22/2010  . Change in bowel function 09/22/2010  . Rectal bleeding 09/22/2010  . GERD (gastroesophageal reflux disease) 09/22/2010  . Esophageal dysphagia 09/22/2010   Past Medical History:  Diagnosis Date  . GERD (gastroesophageal reflux disease)   . Hypothyroid   . Migraine headache   . PONV (postoperative nausea and vomiting)     Past Surgical History:  Procedure Laterality Date  . Bilateral great toe surgery    . DILITATION & CURRETTAGE/HYSTROSCOPY WITH NOVASURE ABLATION  03/03/2012   Procedure: DILATATION & CURETTAGE/HYSTEROSCOPY WITH NOVASURE ABLATION;  Surgeon: Sharene Butters, MD;  Location: [facility];  Service: Gynecology;   Laterality: N/A;  . none      Current Outpatient Medications  Medication Sig Dispense Refill Last Dose  . amoxicillin-clavulanate (AUGMENTIN) 875-125 MG tablet One p o bid for ten d (Patient not taking: Reported on 01/23/2018) 20 tablet 0 Not Taking at Unknown time  . etodolac (LODINE) 400 MG tablet Take 1 tablet (400 mg total) by mouth 2 (two) times daily. (Patient not taking: Reported on 08/30/2017) 28 tablet 1 Not Taking  . fluconazole (DIFLUCAN) 150 MG tablet One p o three d apart (Patient not taking: Reported on 01/23/2018) 2 tablet 0 Not Taking at Unknown time  . ibuprofen (ADVIL,MOTRIN) 200 MG tablet Take 400 mg by mouth every 6 (six) hours as needed for moderate pain.     Marland Kitchen levothyroxine (SYNTHROID, LEVOTHROID) 75 MCG tablet Take 1 tablet (75 mcg total) by mouth daily. (Patient taking differently: Take 75 mcg by mouth daily before breakfast. ) 30 tablet 11 Taking  . omeprazole (PRILOSEC) 20 MG capsule Take 1 capsule (20 mg total) by mouth daily. (Patient not taking: Reported on 09/26/2017) 90 capsule 1 Not Taking   No current facility-administered medications for this visit.    No Known Allergies  Social History   Tobacco Use  . Smoking status: Light Tobacco Smoker    Packs/day: 0.50    Years: 15.00    Pack years: 7.50    Types: Cigarettes  . Smokeless tobacco: Never Used  Substance Use Topics  . Alcohol use: Yes    Comment: a glass of wine    Family History  Problem Relation Age of Onset  . Diverticulitis Mother        ileostomy  . Diverticulitis  Maternal Grandmother   . Colon cancer Neg Hx   . Liver disease Neg Hx   . Crohn's disease Neg Hx   . Ulcerative colitis Neg Hx      Review of Systems  Musculoskeletal: Positive for joint pain.  Neurological: Positive for headaches.  All other systems reviewed and are negative.   Objective:  Physical Exam  Constitutional: She is oriented to person, place, and time. She appears well-developed and well-nourished. No distress.   HENT:  Head: Normocephalic and atraumatic.  Nose: Nose normal.  Eyes: Pupils are equal, round, and reactive to light. Conjunctivae and EOM are normal.  Neck: Normal range of motion. Neck supple.  Cardiovascular: Normal rate, regular rhythm, normal heart sounds and intact distal pulses.  Respiratory: Effort normal and breath sounds normal. No respiratory distress. She has no wheezes.  GI: Soft. Bowel sounds are normal. She exhibits no distension. There is no tenderness.  Musculoskeletal:       Right hip: She exhibits decreased range of motion, tenderness and bony tenderness.  Lymphadenopathy:    She has no cervical adenopathy.  Neurological: She is alert and oriented to person, place, and time. No cranial nerve deficit.  Skin: Skin is warm and dry. No rash noted. No erythema.  Psychiatric: She has a normal mood and affect. Her behavior is normal.    Vital signs in last 24 hours: @VSRANGES @  Labs:   Estimated body mass index is 25.58 kg/m as calculated from the following:   Height as of 09/26/17: 5\' 4"  (1.626 m).   Weight as of 09/26/17: 67.6 kg.   Imaging Review Plain radiographs demonstrate moderate degenerative joint disease of the right hip(s). The bone quality appears to be good for age and reported activity level.    Preoperative templating of the joint replacement has been completed, documented, and submitted to the Operating Room personnel in order to optimize intra-operative equipment management.     Assessment/Plan:  End stage arthritis, right hip(s)  The patient history, physical examination, clinical judgement of the provider and imaging studies are consistent with end stage degenerative joint disease of the right hip(s) and total hip arthroplasty is deemed medically necessary. The treatment options including medical management, injection therapy, arthroscopy and arthroplasty were discussed at length. The risks and benefits of total hip arthroplasty were presented  and reviewed. The risks due to aseptic loosening, infection, stiffness, dislocation/subluxation,  thromboembolic complications and other imponderables were discussed.  The patient acknowledged the explanation, agreed to proceed with the plan and consent was signed. Patient is being admitted for inpatient treatment for surgery, pain control, PT, OT, prophylactic antibiotics, VTE prophylaxis, progressive ambulation and ADL's and discharge planning.The patient is planning to be discharged home with home health services

## 2018-01-25 ENCOUNTER — Encounter (HOSPITAL_COMMUNITY): Payer: Self-pay

## 2018-01-25 NOTE — Patient Instructions (Signed)
Your procedure is scheduled on: Friday, Dec. 13, 2019   Surgery Time:  7:30AM-9:49AM   Report to [facility]  Entrance    Report to admitting at 5:30 AM   Call this number if you have problems the morning of surgery [phone]   Do not eat food or drink liquids :After Midnight.   Brush your teeth the morning of surgery.   Do NOT smoke after Midnight   Take these medicines the morning of surgery with A SIP OF WATER: Levothyroxine                               You may not have any metal on your body including hair pins, jewelry, and body piercings             Do not wear make-up, lotions, powders, perfumes/cologne, or deodorant             Do not wear nail polish.  Do not shave  48 hours prior to surgery.               Do not bring valuables to the hospital. [place].   Contacts, dentures or bridgework may not be worn into surgery.   Leave suitcase in the car. After surgery it may be brought to your room.   Special Instructions: Bring a copy of your healthcare power of attorney and living will documents         the day of surgery if you haven't scanned them in before.              Please read over the following fact sheets you were given:  [facility] - Preparing for Surgery Before surgery, you can play an important role.  Because skin is not sterile, your skin needs to be as free of germs as possible.  You can reduce the number of germs on your skin by washing with CHG (chlorahexidine gluconate) soap before surgery.  CHG is an antiseptic cleaner which kills germs and bonds with the skin to continue killing germs even after washing. Please DO NOT use if you have an allergy to CHG or antibacterial soaps.  If your skin becomes reddened/irritated stop using the CHG and inform your nurse when you arrive at Short Stay. Do not shave (including legs and underarms) for at least 48 hours prior to the first CHG shower.   You may shave your face/neck.  Please follow these instructions carefully:  1.  Shower with CHG Soap the night before surgery and the  morning of surgery.  2.  If you choose to wash your hair, wash your hair first as usual with your normal  shampoo.  3.  After you shampoo, rinse your hair and body thoroughly to remove the shampoo.                             4.  Use CHG as you would any other liquid soap.  You can apply chg directly to the skin and wash.  Gently with a scrungie or clean washcloth.  5.  Apply the CHG Soap to your body ONLY FROM THE NECK DOWN.   Do   not use on face/ open  Wound or open sores. Avoid contact with eyes, ears mouth and   genitals (private parts).                       Wash face,  Genitals (private parts) with your normal soap.             6.  Wash thoroughly, paying special attention to the area where your    surgery  will be performed.  7.  Thoroughly rinse your body with warm water from the neck down.  8.  DO NOT shower/wash with your normal soap after using and rinsing off the CHG Soap.                9.  Pat yourself dry with a clean towel.            10.  Wear clean pajamas.            11.  Place clean sheets on your bed the night of your first shower and do not  sleep with pets. Day of Surgery : Do not apply any lotions/deodorants the morning of surgery.  Please wear clean clothes to the hospital/surgery center.  FAILURE TO FOLLOW THESE INSTRUCTIONS MAY RESULT IN THE CANCELLATION OF YOUR SURGERY  PATIENT SIGNATURE_________________________________  NURSE SIGNATURE__________________________________  ________________________________________________________________________   Madison Aguilar  An incentive spirometer is a tool that can help keep your lungs clear and active. This tool measures how well you are filling your lungs with each breath. Taking long deep breaths may help reverse or decrease the chance of developing  breathing (pulmonary) problems (especially infection) following:  A long period of time when you are unable to move or be active. BEFORE THE PROCEDURE   If the spirometer includes an indicator to show your best effort, your nurse or respiratory therapist will set it to a desired goal.  If possible, sit up straight or lean slightly forward. Try not to slouch.  Hold the incentive spirometer in an upright position. INSTRUCTIONS FOR USE  1. Sit on the edge of your bed if possible, or sit up as far as you can in bed or on a chair. 2. Hold the incentive spirometer in an upright position. 3. Breathe out normally. 4. Place the mouthpiece in your mouth and seal your lips tightly around it. 5. Breathe in slowly and as deeply as possible, raising the piston or the ball toward the top of the column. 6. Hold your breath for 3-5 seconds or for as long as possible. Allow the piston or ball to fall to the bottom of the column. 7. Remove the mouthpiece from your mouth and breathe out normally. 8. Rest for a few seconds and repeat Steps 1 through 7 at least 10 times every 1-2 hours when you are awake. Take your time and take a few normal breaths between deep breaths. 9. The spirometer may include an indicator to show your best effort. Use the indicator as a goal to work toward during each repetition. 10. After each set of 10 deep breaths, practice coughing to be sure your lungs are clear. If you have an incision (the cut made at the time of surgery), support your incision when coughing by placing a pillow or rolled up towels firmly against it. Once you are able to get out of bed, walk around indoors and cough well. You may stop using the incentive spirometer when instructed by your caregiver.  RISKS AND COMPLICATIONS  Take your time  so you do not get dizzy or light-headed.  If you are in pain, you may need to take or ask for pain medication before doing incentive spirometry. It is harder to take a deep  breath if you are having pain. AFTER USE  Rest and breathe slowly and easily.  It can be helpful to keep track of a log of your progress. Your caregiver can provide you with a simple table to help with this. If you are using the spirometer at home, follow these instructions: [place] IF:   You are having difficultly using the spirometer.  You have trouble using the spirometer as often as instructed.  Your pain medication is not giving enough relief while using the spirometer.  You develop fever of 100.5 F (38.1 C) or higher. SEEK IMMEDIATE MEDICAL CARE IF:   You cough up bloody sputum that had not been present before.  You develop fever of 102 F (38.9 C) or greater.  You develop worsening pain at or near the incision site. MAKE SURE YOU:   Understand these instructions.  Will watch your condition.  Will get help right away if you are not doing well or get worse. Document Released: 06/21/2006 Document Revised: 05/03/2011 Document Reviewed: 08/22/2006 Exit[employer] Patient Information 2014 [employer], [place].   ________________________________________________________________________  WHAT IS A BLOOD TRANSFUSION? Blood Transfusion Information  A transfusion is the replacement of blood or some of its parts. Blood is made up of multiple cells which provide different functions.  Red blood cells carry oxygen and are used for blood loss replacement.  White blood cells fight against infection.  Platelets control bleeding.  Plasma helps clot blood.  Other blood products are available for specialized needs, such as hemophilia or other clotting disorders. BEFORE THE TRANSFUSION  Who gives blood for transfusions?   Healthy volunteers who are fully evaluated to make sure their blood is safe. This is blood bank blood. Transfusion therapy is the safest it has ever been in the practice of medicine. Before blood is taken from a donor, a complete history is taken to make sure  that person has no history of diseases nor engages in risky social behavior (examples are intravenous drug use or sexual activity with multiple partners). The donor's travel history is screened to minimize risk of transmitting infections, such as malaria. The donated blood is tested for signs of infectious diseases, such as HIV and hepatitis. The blood is then tested to be sure it is compatible with you in order to minimize the chance of a transfusion reaction. If you or a relative donates blood, this is often done in anticipation of surgery and is not appropriate for emergency situations. It takes many days to process the donated blood. RISKS AND COMPLICATIONS Although transfusion therapy is very safe and saves many lives, the main dangers of transfusion include:   Getting an infectious disease.  Developing a transfusion reaction. This is an allergic reaction to something in the blood you were given. Every precaution is taken to prevent this. The decision to have a blood transfusion has been considered carefully by your caregiver before blood is given. Blood is not given unless the benefits outweigh the risks. AFTER THE TRANSFUSION  Right after receiving a blood transfusion, you will usually feel much better and more energetic. This is especially true if your red blood cells have gotten low (anemic). The transfusion raises the level of the red blood cells which carry oxygen, and this usually causes an energy increase.  The  nurse administering the transfusion will monitor you carefully for complications. HOME CARE INSTRUCTIONS  No special instructions are needed after a transfusion. You may find your energy is better. Speak with your caregiver about any limitations on activity for underlying diseases you may have. SEEK MEDICAL CARE IF:   Your condition is not improving after your transfusion.  You develop redness or irritation at the intravenous (IV) site. SEEK IMMEDIATE MEDICAL CARE IF:  Any of  the following symptoms occur over the next 12 hours:  Shaking chills.  You have a temperature by mouth above 102 F (38.9 C), not controlled by medicine.  Chest, back, or muscle pain.  People around you feel you are not acting correctly or are confused.  Shortness of breath or difficulty breathing.  Dizziness and fainting.  You get a rash or develop hives.  You have a decrease in urine output.  Your urine turns a dark color or changes to pink, red, or brown. Any of the following symptoms occur over the next 10 days:  You have a temperature by mouth above 102 F (38.9 C), not controlled by medicine.  Shortness of breath.  Weakness after normal activity.  The white part of the eye turns yellow (jaundice).  You have a decrease in the amount of urine or are urinating less often.  Your urine turns a dark color or changes to pink, red, or brown. Document Released: 02/06/2000 Document Revised: 05/03/2011 Document Reviewed: 09/25/2007 [facility] Patient Information 2014 [place], [place].  _______________________________________________________________________

## 2018-01-27 ENCOUNTER — Ambulatory Visit (HOSPITAL_COMMUNITY)
Admission: RE | Admit: 2018-01-27 | Discharge: 2018-01-27 | Disposition: A | Discharge status: Home / Self Care | Payer: 59 | Source: Ambulatory Visit | Attending: Physician Assistant | Admitting: Physician Assistant

## 2018-01-27 ENCOUNTER — Encounter (HOSPITAL_COMMUNITY)
Admission: RE | Admit: 2018-01-27 | Discharge: 2018-01-27 | Disposition: A | Discharge status: Home / Self Care | Payer: 59 | Source: Ambulatory Visit | Attending: Orthopedic Surgery | Admitting: Orthopedic Surgery

## 2018-01-27 ENCOUNTER — Ambulatory Visit: Payer: Self-pay | Admitting: Physician Assistant

## 2018-01-27 ENCOUNTER — Other Ambulatory Visit: Payer: Self-pay

## 2018-01-27 ENCOUNTER — Encounter (HOSPITAL_COMMUNITY): Payer: Self-pay

## 2018-01-27 DIAGNOSIS — Z01818 Encounter for other preprocedural examination: Secondary | ICD-10-CM | POA: Insufficient documentation

## 2018-01-27 DIAGNOSIS — M1611 Unilateral primary osteoarthritis, right hip: Secondary | ICD-10-CM

## 2018-01-27 DIAGNOSIS — Z87891 Personal history of nicotine dependence: Secondary | ICD-10-CM | POA: Diagnosis not present

## 2018-01-27 HISTORY — DX: Asymptomatic varicose veins of unspecified lower extremity: I83.90

## 2018-01-27 HISTORY — DX: Unspecified osteoarthritis, unspecified site: M19.90

## 2018-01-27 HISTORY — DX: Tinnitus, bilateral: H93.13

## 2018-01-27 HISTORY — DX: Diverticulosis of intestine, part unspecified, without perforation or abscess without bleeding: K57.90

## 2018-01-27 HISTORY — DX: Personal history of diseases of the blood and blood-forming organs and certain disorders involving the immune mechanism: Z86.2

## 2018-01-27 HISTORY — DX: Plantar fascial fibromatosis: M72.2

## 2018-01-27 HISTORY — DX: Hypotension, unspecified: I95.9

## 2018-01-27 HISTORY — DX: Personal history of colonic polyps: Z86.010

## 2018-01-27 LAB — CBC WITH DIFFERENTIAL/PLATELET
Abs Immature Granulocytes: 0.04 10*3/uL (ref 0.00–0.07)
BASOS PCT: 1 %
Basophils Absolute: 0.1 10*3/uL (ref 0.0–0.1)
Eosinophils Absolute: 0.3 10*3/uL (ref 0.0–0.5)
Eosinophils Relative: 3 %
HCT: 43 % (ref 36.0–46.0)
Hemoglobin: 13.5 g/dL (ref 12.0–15.0)
Immature Granulocytes: 0 %
Lymphocytes Relative: 21 %
Lymphs Abs: 1.9 10*3/uL (ref 0.7–4.0)
MCH: 31 pg (ref 26.0–34.0)
MCHC: 31.4 g/dL (ref 30.0–36.0)
MCV: 98.9 fL (ref 80.0–100.0)
Monocytes Absolute: 0.6 10*3/uL (ref 0.1–1.0)
Monocytes Relative: 7 %
Neutro Abs: 6.2 10*3/uL (ref 1.7–7.7)
Neutrophils Relative %: 68 %
Platelets: 330 10*3/uL (ref 150–400)
RBC: 4.35 MIL/uL (ref 3.87–5.11)
RDW: 13.3 % (ref 11.5–15.5)
WBC: 9.1 10*3/uL (ref 4.0–10.5)
nRBC: 0 % (ref 0.0–0.2)

## 2018-01-27 LAB — URINALYSIS, ROUTINE W REFLEX MICROSCOPIC
BACTERIA UA: NONE SEEN
Bilirubin Urine: NEGATIVE
Glucose, UA: NEGATIVE mg/dL
Ketones, ur: NEGATIVE mg/dL
Leukocytes, UA: NEGATIVE
Nitrite: NEGATIVE
Protein, ur: NEGATIVE mg/dL
Specific Gravity, Urine: 1.012 (ref 1.005–1.030)
pH: 6 (ref 5.0–8.0)

## 2018-01-27 LAB — PROTIME-INR
INR: 0.91
Prothrombin Time: 12.2 seconds (ref 11.4–15.2)

## 2018-01-27 LAB — COMPREHENSIVE METABOLIC PANEL
ALBUMIN: 4.1 g/dL (ref 3.5–5.0)
ALT: 12 U/L (ref 0–44)
AST: 14 U/L — ABNORMAL LOW (ref 15–41)
Alkaline Phosphatase: 65 U/L (ref 38–126)
Anion gap: 8 (ref 5–15)
BUN: 10 mg/dL (ref 6–20)
CHLORIDE: 105 mmol/L (ref 98–111)
CO2: 28 mmol/L (ref 22–32)
Calcium: 9.2 mg/dL (ref 8.9–10.3)
Creatinine, Ser: 0.79 mg/dL (ref 0.44–1.00)
GFR calc Af Amer: >60 mL/min (ref >60)
GFR calc non Af Amer: >60 mL/min (ref >60)
Glucose, Bld: 90 mg/dL (ref 70–99)
Potassium: 4 mmol/L (ref 3.5–5.1)
Sodium: 141 mmol/L (ref 135–145)
Total Bilirubin: 0.7 mg/dL (ref 0.3–1.2)
Total Protein: 6.9 g/dL (ref 6.5–8.1)

## 2018-01-27 LAB — SURGICAL PCR SCREEN
MRSA, PCR: NEGATIVE
STAPHYLOCOCCUS AUREUS: NEGATIVE

## 2018-01-27 LAB — ABO/RH: ABO/RH(D): A POS

## 2018-01-27 LAB — APTT: APTT: 30 s (ref 24–36)

## 2018-01-27 NOTE — Pre-Procedure Instructions (Signed)
UA results 01/27/2018 sent to Dr. French Ana via epic.

## 2018-01-28 LAB — URINE CULTURE: Culture: NO GROWTH

## 2018-02-02 MED ORDER — TRANEXAMIC ACID 1000 MG/10ML IV SOLN
2000.0000 mg | INTRAVENOUS | Status: DC
  Filled 2018-02-02 (×2): qty 20

## 2018-02-02 MED ORDER — BUPIVACAINE LIPOSOME 1.3 % IJ SUSP
20.0000 mL | INTRAMUSCULAR | Status: DC
  Filled 2018-02-02: qty 20

## 2018-02-02 NOTE — Anesthesia Preprocedure Evaluation (Addendum)
Anesthesia Evaluation  Patient identified by MRN, date of birth, ID band Patient awake    Reviewed: Allergy & Precautions, NPO status , Patient's Chart, lab work & pertinent test results  History of Anesthesia Complications Negative for: history of anesthetic complications  Airway Mallampati: II  TM Distance: >3 FB Neck ROM: Full    Dental no notable dental hx. (+) Dental Advisory Given   Pulmonary Current Smoker,    Pulmonary exam normal        Cardiovascular negative cardio ROS Normal cardiovascular exam     Neuro/Psych  Headaches, negative psych ROS   GI/Hepatic Neg liver ROS, GERD  Medicated,  Endo/Other  Hypothyroidism   Renal/GU negative Renal ROS     Musculoskeletal negative musculoskeletal ROS (+)   Abdominal   Peds  Hematology negative hematology ROS (+)   Anesthesia Other Findings Day of surgery medications reviewed with the patient.  Reproductive/Obstetrics                            Anesthesia Physical Anesthesia Plan  ASA: II  Anesthesia Plan: Spinal   Post-op Pain Management:    Induction:   PONV Risk Score and Plan: 2 and Ondansetron and Propofol infusion  Airway Management Planned: Natural Airway and Simple Face Mask  Additional Equipment:   Intra-op Plan:   Post-operative Plan:   Informed Consent: I have reviewed the patients History and Physical, chart, labs and discussed the procedure including the risks, benefits and alternatives for the proposed anesthesia with the patient or authorized representative who has indicated his/her understanding and acceptance.   Dental advisory given  Plan Discussed with: CRNA and Anesthesiologist  Anesthesia Plan Comments:        Anesthesia Quick Evaluation

## 2018-02-03 ENCOUNTER — Inpatient Hospital Stay (HOSPITAL_COMMUNITY): Payer: 59 | Admitting: Anesthesiology

## 2018-02-03 ENCOUNTER — Other Ambulatory Visit: Payer: Self-pay

## 2018-02-03 ENCOUNTER — Encounter (HOSPITAL_COMMUNITY): Payer: Self-pay

## 2018-02-03 ENCOUNTER — Inpatient Hospital Stay (HOSPITAL_COMMUNITY): Payer: 59

## 2018-02-03 ENCOUNTER — Inpatient Hospital Stay (HOSPITAL_COMMUNITY)
Admission: RE | Admit: 2018-02-03 | Discharge: 2018-02-05 | DRG: 470 | Disposition: A | Discharge status: Home Health | Payer: 59 | Attending: Orthopedic Surgery | Admitting: Orthopedic Surgery

## 2018-02-03 ENCOUNTER — Encounter (HOSPITAL_COMMUNITY)
Admission: RE | Disposition: A | Discharge status: Home Health | Payer: Self-pay | Source: Home / Self Care | Attending: Orthopedic Surgery

## 2018-02-03 DIAGNOSIS — E039 Hypothyroidism, unspecified: Secondary | ICD-10-CM | POA: Diagnosis present

## 2018-02-03 DIAGNOSIS — M1611 Unilateral primary osteoarthritis, right hip: Secondary | ICD-10-CM | POA: Diagnosis not present

## 2018-02-03 DIAGNOSIS — Z86010 Personal history of colonic polyps: Secondary | ICD-10-CM | POA: Diagnosis not present

## 2018-02-03 DIAGNOSIS — F17210 Nicotine dependence, cigarettes, uncomplicated: Secondary | ICD-10-CM | POA: Diagnosis present

## 2018-02-03 DIAGNOSIS — Z79890 Hormone replacement therapy (postmenopausal): Secondary | ICD-10-CM

## 2018-02-03 DIAGNOSIS — K219 Gastro-esophageal reflux disease without esophagitis: Secondary | ICD-10-CM | POA: Diagnosis not present

## 2018-02-03 DIAGNOSIS — M722 Plantar fascial fibromatosis: Secondary | ICD-10-CM | POA: Diagnosis present

## 2018-02-03 DIAGNOSIS — Z96641 Presence of right artificial hip joint: Secondary | ICD-10-CM | POA: Diagnosis not present

## 2018-02-03 DIAGNOSIS — M25551 Pain in right hip: Secondary | ICD-10-CM | POA: Diagnosis not present

## 2018-02-03 DIAGNOSIS — Z471 Aftercare following joint replacement surgery: Secondary | ICD-10-CM | POA: Diagnosis not present

## 2018-02-03 HISTORY — PX: TOTAL HIP ARTHROPLASTY: SHX124

## 2018-02-03 LAB — TYPE AND SCREEN
ABO/RH(D): A POS
Antibody Screen: NEGATIVE

## 2018-02-03 SURGERY — ARTHROPLASTY, HIP, TOTAL,POSTERIOR APPROACH
Anesthesia: Spinal | Site: Hip | Laterality: Right

## 2018-02-03 MED ORDER — PHENYLEPHRINE HCL 10 MG/ML IJ SOLN
INTRAMUSCULAR | Status: AC
  Filled 2018-02-03: qty 1

## 2018-02-03 MED ORDER — SODIUM CHLORIDE (PF) 0.9 % IJ SOLN
INTRAMUSCULAR | Status: DC | PRN
  Administered 2018-02-03: 10 mL

## 2018-02-03 MED ORDER — CHLORHEXIDINE GLUCONATE 4 % EX LIQD: 60.0000 mL | Freq: Once | CUTANEOUS | Status: DC

## 2018-02-03 MED ORDER — MIDAZOLAM HCL 2 MG/2ML IJ SOLN
INTRAMUSCULAR | Status: AC
  Filled 2018-02-03: qty 2

## 2018-02-03 MED ORDER — HYDROMORPHONE HCL 1 MG/ML IJ SOLN: 0.2500 mg | INTRAMUSCULAR | Status: DC | PRN

## 2018-02-03 MED ORDER — METHOCARBAMOL 500 MG PO TABS: 500.0000 mg | ORAL_TABLET | Freq: Four times a day (QID) | ORAL | 0 refills | Status: DC | PRN

## 2018-02-03 MED ORDER — PROMETHAZINE HCL 25 MG/ML IJ SOLN: 6.2500 mg | INTRAMUSCULAR | Status: DC | PRN

## 2018-02-03 MED ORDER — SODIUM CHLORIDE (PF) 0.9 % IJ SOLN
INTRAMUSCULAR | Status: AC
  Filled 2018-02-03: qty 10

## 2018-02-03 MED ORDER — PROPOFOL 10 MG/ML IV BOLUS
INTRAVENOUS | Status: AC
  Filled 2018-02-03: qty 20

## 2018-02-03 MED ORDER — SODIUM CHLORIDE 0.9 % IR SOLN
Status: DC | PRN
  Administered 2018-02-03 (×2): 1000 mL

## 2018-02-03 MED ORDER — BUPIVACAINE LIPOSOME 1.3 % IJ SUSP: 20.0000 mL | Freq: Once | INTRAMUSCULAR | Status: DC

## 2018-02-03 MED ORDER — ALBUMIN HUMAN 5 % IV SOLN
12.5000 g | Freq: Once | INTRAVENOUS | Status: AC
  Administered 2018-02-03: 12.5 g via INTRAVENOUS

## 2018-02-03 MED ORDER — CEFAZOLIN SODIUM-DEXTROSE 1-4 GM/50ML-% IV SOLN: 1.0000 g | Freq: Four times a day (QID) | INTRAVENOUS | Status: DC

## 2018-02-03 MED ORDER — HYDROMORPHONE HCL 1 MG/ML IJ SOLN
0.5000 mg | INTRAMUSCULAR | Status: DC | PRN
  Administered 2018-02-03: 0.5 mg via INTRAVENOUS
  Filled 2018-02-03: qty 1

## 2018-02-03 MED ORDER — LACTATED RINGERS IV SOLN
INTRAVENOUS | Status: DC
  Administered 2018-02-03 (×3): via INTRAVENOUS

## 2018-02-03 MED ORDER — FENTANYL CITRATE (PF) 100 MCG/2ML IJ SOLN
INTRAMUSCULAR | Status: DC | PRN
  Administered 2018-02-03: 100 ug via INTRAVENOUS

## 2018-02-03 MED ORDER — TRANEXAMIC ACID-NACL 1000-0.7 MG/100ML-% IV SOLN
1000.0000 mg | Freq: Once | INTRAVENOUS | Status: AC
  Administered 2018-02-03: 1000 mg via INTRAVENOUS
  Filled 2018-02-03: qty 100

## 2018-02-03 MED ORDER — FLEET ENEMA 7-19 GM/118ML RE ENEM: 1.0000 | ENEMA | Freq: Once | RECTAL | Status: DC | PRN

## 2018-02-03 MED ORDER — SORBITOL 70 % SOLN
30.0000 mL | Freq: Every day | Status: DC | PRN
  Filled 2018-02-03: qty 30

## 2018-02-03 MED ORDER — GLYCOPYRROLATE PF 0.2 MG/ML IJ SOSY
PREFILLED_SYRINGE | INTRAMUSCULAR | Status: DC | PRN
  Administered 2018-02-03 (×2): .2 mg via INTRAVENOUS

## 2018-02-03 MED ORDER — METHOCARBAMOL 500 MG IVPB - SIMPLE MED
500.0000 mg | Freq: Four times a day (QID) | INTRAVENOUS | Status: DC | PRN
  Administered 2018-02-03: 500 mg via INTRAVENOUS
  Filled 2018-02-03: qty 50

## 2018-02-03 MED ORDER — ONDANSETRON HCL 4 MG PO TABS: 4.0000 mg | ORAL_TABLET | Freq: Four times a day (QID) | ORAL | Status: DC | PRN

## 2018-02-03 MED ORDER — DEXAMETHASONE SODIUM PHOSPHATE 10 MG/ML IJ SOLN
INTRAMUSCULAR | Status: AC
  Filled 2018-02-03: qty 1

## 2018-02-03 MED ORDER — MENTHOL 3 MG MT LOZG: 1.0000 | LOZENGE | OROMUCOSAL | Status: DC | PRN

## 2018-02-03 MED ORDER — PHENYLEPHRINE 40 MCG/ML (10ML) SYRINGE FOR IV PUSH (FOR BLOOD PRESSURE SUPPORT)
PREFILLED_SYRINGE | INTRAVENOUS | Status: AC
  Filled 2018-02-03: qty 10

## 2018-02-03 MED ORDER — ACETAMINOPHEN 500 MG PO TABS
1000.0000 mg | ORAL_TABLET | Freq: Four times a day (QID) | ORAL | Status: AC
  Administered 2018-02-03 – 2018-02-04 (×4): 1000 mg via ORAL
  Filled 2018-02-03 (×4): qty 2

## 2018-02-03 MED ORDER — EPHEDRINE SULFATE-NACL 50-0.9 MG/10ML-% IV SOSY
PREFILLED_SYRINGE | INTRAVENOUS | Status: DC | PRN
  Administered 2018-02-03 (×2): 15 mg via INTRAVENOUS
  Administered 2018-02-03 (×2): 10 mg via INTRAVENOUS

## 2018-02-03 MED ORDER — METHOCARBAMOL 500 MG PO TABS
500.0000 mg | ORAL_TABLET | Freq: Four times a day (QID) | ORAL | Status: DC | PRN
  Administered 2018-02-04: 500 mg via ORAL
  Filled 2018-02-03 (×3): qty 1

## 2018-02-03 MED ORDER — LEVOTHYROXINE SODIUM 75 MCG PO TABS
75.0000 ug | ORAL_TABLET | Freq: Every day | ORAL | Status: DC
  Administered 2018-02-04 – 2018-02-05 (×2): 75 ug via ORAL
  Filled 2018-02-03 (×2): qty 1

## 2018-02-03 MED ORDER — EPHEDRINE 5 MG/ML INJ
INTRAVENOUS | Status: AC
  Filled 2018-02-03: qty 10

## 2018-02-03 MED ORDER — BUPIVACAINE-EPINEPHRINE 0.25% -1:200000 IJ SOLN
INTRAMUSCULAR | Status: DC | PRN
  Administered 2018-02-03: 10 mL

## 2018-02-03 MED ORDER — GLYCOPYRROLATE PF 0.2 MG/ML IJ SOSY
PREFILLED_SYRINGE | INTRAMUSCULAR | Status: AC
  Filled 2018-02-03: qty 1

## 2018-02-03 MED ORDER — FENTANYL CITRATE (PF) 100 MCG/2ML IJ SOLN
INTRAMUSCULAR | Status: AC
  Filled 2018-02-03: qty 2

## 2018-02-03 MED ORDER — OXYCODONE HCL 5 MG PO TABS
ORAL_TABLET | ORAL | Status: AC
  Filled 2018-02-03: qty 10

## 2018-02-03 MED ORDER — ONDANSETRON HCL 4 MG/2ML IJ SOLN
INTRAMUSCULAR | Status: DC | PRN
  Administered 2018-02-03: 4 mg via INTRAVENOUS

## 2018-02-03 MED ORDER — TRANEXAMIC ACID-NACL 1000-0.7 MG/100ML-% IV SOLN
1000.0000 mg | INTRAVENOUS | Status: AC
  Administered 2018-02-03: 1000 mg via INTRAVENOUS
  Filled 2018-02-03: qty 100

## 2018-02-03 MED ORDER — CEFAZOLIN SODIUM-DEXTROSE 1-4 GM/50ML-% IV SOLN
1.0000 g | Freq: Four times a day (QID) | INTRAVENOUS | Status: AC
  Administered 2018-02-03 (×2): 1 g via INTRAVENOUS
  Filled 2018-02-03 (×3): qty 50

## 2018-02-03 MED ORDER — SENNOSIDES-DOCUSATE SODIUM 8.6-50 MG PO TABS: 1.0000 | ORAL_TABLET | Freq: Every evening | ORAL | Status: DC | PRN

## 2018-02-03 MED ORDER — DOCUSATE SODIUM 100 MG PO CAPS
100.0000 mg | ORAL_CAPSULE | Freq: Two times a day (BID) | ORAL | Status: DC
  Administered 2018-02-03 – 2018-02-05 (×4): 100 mg via ORAL
  Filled 2018-02-03 (×4): qty 1

## 2018-02-03 MED ORDER — CEFAZOLIN SODIUM-DEXTROSE 2-4 GM/100ML-% IV SOLN: 2.0000 g | INTRAVENOUS | Status: DC

## 2018-02-03 MED ORDER — ALBUMIN HUMAN 5 % IV SOLN
INTRAVENOUS | Status: AC
  Administered 2018-02-03: 12.5 g via INTRAVENOUS
  Filled 2018-02-03: qty 250

## 2018-02-03 MED ORDER — OXYCODONE HCL 5 MG PO TABS: ORAL_TABLET | ORAL | 0 refills | Status: DC

## 2018-02-03 MED ORDER — ACETAMINOPHEN 325 MG PO TABS: 325.0000 mg | ORAL_TABLET | Freq: Four times a day (QID) | ORAL | Status: DC | PRN

## 2018-02-03 MED ORDER — ASPIRIN EC 81 MG PO TBEC: 81.0000 mg | DELAYED_RELEASE_TABLET | Freq: Two times a day (BID) | ORAL | 0 refills | Status: AC

## 2018-02-03 MED ORDER — ONDANSETRON HCL 4 MG/2ML IJ SOLN
INTRAMUSCULAR | Status: AC
  Filled 2018-02-03: qty 2

## 2018-02-03 MED ORDER — DIPHENHYDRAMINE HCL 50 MG/ML IJ SOLN
INTRAMUSCULAR | Status: AC
  Filled 2018-02-03: qty 1

## 2018-02-03 MED ORDER — OXYCODONE HCL 5 MG PO TABS
ORAL_TABLET | ORAL | Status: AC
  Administered 2018-02-03: 10 mg via ORAL
  Filled 2018-02-03: qty 2

## 2018-02-03 MED ORDER — MIDAZOLAM HCL 5 MG/5ML IJ SOLN
INTRAMUSCULAR | Status: DC | PRN
  Administered 2018-02-03: 2 mg via INTRAVENOUS

## 2018-02-03 MED ORDER — METOCLOPRAMIDE HCL 5 MG/ML IJ SOLN: 5.0000 mg | Freq: Three times a day (TID) | INTRAMUSCULAR | Status: DC | PRN

## 2018-02-03 MED ORDER — FENTANYL CITRATE (PF) 100 MCG/2ML IJ SOLN
INTRAMUSCULAR | Status: AC
  Administered 2018-02-03: 25 ug via INTRAVENOUS
  Filled 2018-02-03: qty 2

## 2018-02-03 MED ORDER — ALBUMIN HUMAN 5 % IV SOLN
INTRAVENOUS | Status: AC
  Filled 2018-02-03: qty 250

## 2018-02-03 MED ORDER — CEFAZOLIN SODIUM-DEXTROSE 2-4 GM/100ML-% IV SOLN
2.0000 g | INTRAVENOUS | Status: AC
  Administered 2018-02-03: 2 g via INTRAVENOUS
  Filled 2018-02-03: qty 100

## 2018-02-03 MED ORDER — SODIUM CHLORIDE 0.9 % IV SOLN
INTRAVENOUS | Status: DC
  Administered 2018-02-03 – 2018-02-04 (×2): via INTRAVENOUS

## 2018-02-03 MED ORDER — BUPIVACAINE LIPOSOME 1.3 % IJ SUSP
INTRAMUSCULAR | Status: DC | PRN
  Administered 2018-02-03: 10 mL

## 2018-02-03 MED ORDER — FENTANYL CITRATE (PF) 100 MCG/2ML IJ SOLN
25.0000 ug | INTRAMUSCULAR | Status: DC | PRN
  Administered 2018-02-03 (×2): 25 ug via INTRAVENOUS
  Administered 2018-02-03: 50 ug via INTRAVENOUS

## 2018-02-03 MED ORDER — BUPIVACAINE HCL (PF) 0.5 % IJ SOLN
INTRAMUSCULAR | Status: DC | PRN
  Administered 2018-02-03: 15 mg via INTRATHECAL

## 2018-02-03 MED ORDER — BUPIVACAINE-EPINEPHRINE (PF) 0.25% -1:200000 IJ SOLN
INTRAMUSCULAR | Status: AC
  Filled 2018-02-03: qty 30

## 2018-02-03 MED ORDER — TRANEXAMIC ACID-NACL 1000-0.7 MG/100ML-% IV SOLN
1000.0000 mg | INTRAVENOUS | Status: AC
  Administered 2018-02-03: 1000 mg via INTRAVENOUS
  Filled 2018-02-03 (×2): qty 100

## 2018-02-03 MED ORDER — PHENYLEPHRINE 40 MCG/ML (10ML) SYRINGE FOR IV PUSH (FOR BLOOD PRESSURE SUPPORT)
PREFILLED_SYRINGE | INTRAVENOUS | Status: DC | PRN
  Administered 2018-02-03 (×2): 80 ug via INTRAVENOUS

## 2018-02-03 MED ORDER — SODIUM CHLORIDE 0.9 % IV SOLN
INTRAVENOUS | Status: DC | PRN
  Administered 2018-02-03: 20 ug/min via INTRAVENOUS

## 2018-02-03 MED ORDER — OXYCODONE HCL 5 MG PO TABS
5.0000 mg | ORAL_TABLET | ORAL | Status: DC | PRN
  Administered 2018-02-03 (×3): 10 mg via ORAL
  Administered 2018-02-04: 5 mg via ORAL
  Administered 2018-02-04: 10 mg via ORAL
  Administered 2018-02-04 – 2018-02-05 (×4): 5 mg via ORAL
  Filled 2018-02-03 (×2): qty 2
  Filled 2018-02-03: qty 1
  Filled 2018-02-03: qty 2
  Filled 2018-02-03 (×3): qty 1
  Filled 2018-02-03: qty 2

## 2018-02-03 MED ORDER — PROPOFOL 500 MG/50ML IV EMUL
INTRAVENOUS | Status: DC | PRN
  Administered 2018-02-03: 75 ug/kg/min via INTRAVENOUS
  Administered 2018-02-03: 100 ug/kg/min via INTRAVENOUS

## 2018-02-03 MED ORDER — METOCLOPRAMIDE HCL 5 MG PO TABS
5.0000 mg | ORAL_TABLET | Freq: Three times a day (TID) | ORAL | Status: DC | PRN
  Administered 2018-02-04: 5 mg via ORAL
  Filled 2018-02-03: qty 2

## 2018-02-03 MED ORDER — PHENOL 1.4 % MT LIQD
1.0000 | OROMUCOSAL | Status: DC | PRN
  Filled 2018-02-03: qty 177

## 2018-02-03 MED ORDER — ONDANSETRON HCL 4 MG/2ML IJ SOLN
4.0000 mg | Freq: Four times a day (QID) | INTRAMUSCULAR | Status: DC | PRN
  Filled 2018-02-03: qty 2

## 2018-02-03 MED ORDER — SODIUM CHLORIDE 0.9 % IV SOLN: INTRAVENOUS | Status: DC

## 2018-02-03 MED ORDER — ASPIRIN 81 MG PO CHEW
81.0000 mg | CHEWABLE_TABLET | Freq: Two times a day (BID) | ORAL | Status: DC
  Administered 2018-02-03 – 2018-02-05 (×4): 81 mg via ORAL
  Filled 2018-02-03 (×4): qty 1

## 2018-02-03 MED ORDER — METHOCARBAMOL 500 MG IVPB - SIMPLE MED
INTRAVENOUS | Status: AC
  Filled 2018-02-03: qty 50

## 2018-02-03 SURGICAL SUPPLY — 51 items
BAG SPEC THK2 15X12 ZIP CLS (MISCELLANEOUS) ×1
BAG ZIPLOCK 12X15 (MISCELLANEOUS) ×3 IMPLANT
BALL CTTN LRG ABS STRL LF (GAUZE/BANDAGES/DRESSINGS) ×1
BLADE EXTENDED COATED 6.5IN (ELECTRODE) ×2 IMPLANT
BLADE SAW SAG 73X25 THK (BLADE) ×1
BLADE SAW SGTL 73X25 THK (BLADE) ×2 IMPLANT
BLADE SURG SZ10 CARB STEEL (BLADE) ×6 IMPLANT
COTTONBALL LRG STERILE PKG (GAUZE/BANDAGES/DRESSINGS) ×2 IMPLANT
COVER SURGICAL LIGHT HANDLE (MISCELLANEOUS) ×3 IMPLANT
COVER WAND RF STERILE (DRAPES) ×2 IMPLANT
DRAPE INCISE IOBAN 66X45 STRL (DRAPES) ×3 IMPLANT
DRAPE ORTHO SPLIT 77X108 STRL (DRAPES) ×3
DRAPE POUCH INSTRU U-SHP 10X18 (DRAPES) ×3 IMPLANT
DRAPE SURG ORHT 6 SPLT 77X108 (DRAPES) ×1 IMPLANT
DRAPE U-SHAPE 47X51 STRL (DRAPES) ×3 IMPLANT
DRAPE WARM FLUID 44X44 (DRAPE) ×1 IMPLANT
DRSG ADAPTIC 3X8 NADH LF (GAUZE/BANDAGES/DRESSINGS) ×2 IMPLANT
DRSG EMULSION OIL 3X16 NADH (GAUZE/BANDAGES/DRESSINGS) ×3 IMPLANT
ELECT REM PT RETURN 15FT ADLT (MISCELLANEOUS) ×3 IMPLANT
ELIMINATOR HOLE APEX DEPUY (Hips) ×2 IMPLANT
EVACUATOR 1/8 PVC DRAIN (DRAIN) ×1 IMPLANT
FACESHIELD WRAPAROUND (MASK) ×15 IMPLANT
FACESHIELD WRAPAROUND OR TEAM (MASK) ×5 IMPLANT
GAUZE SPONGE 4X4 12PLY STRL (GAUZE/BANDAGES/DRESSINGS) ×2 IMPLANT
GLOVE BIO SURGEON STRL SZ7.5 (GLOVE) ×3 IMPLANT
GLOVE SURG ORTHO 8.0 STRL STRW (GLOVE) ×3 IMPLANT
GOWN STRL REUS W/TWL XL LVL3 (GOWN DISPOSABLE) ×6 IMPLANT
HEAD CERAMIC DELTA 36 PLUS 1.5 (Hips) ×2 IMPLANT
KIT BASIN OR (CUSTOM PROCEDURE TRAY) ×3 IMPLANT
LINER NEUTRAL 52X36X52 PLUS 4 (Liner) ×2 IMPLANT
MANIFOLD NEPTUNE II (INSTRUMENTS) ×3 IMPLANT
NDL MA TROC 1/2 (NEEDLE) ×1 IMPLANT
NEEDLE MA TROC 1/2 (NEEDLE) IMPLANT
NS IRRIG 1000ML POUR BTL (IV SOLUTION) ×3 IMPLANT
PAD ABD 8X10 STRL (GAUZE/BANDAGES/DRESSINGS) ×2 IMPLANT
PASSER SUT SWANSON 36MM LOOP (INSTRUMENTS) ×1 IMPLANT
PIN SECTOR W/GRIP ACE CUP 52MM (Hips) ×2 IMPLANT
PROTECTOR NERVE ULNAR (MISCELLANEOUS) ×3 IMPLANT
STAPLER VISISTAT 35W (STAPLE) ×3 IMPLANT
STEM FEM CMNTLSS SM AML 13.5 (Hips) ×2 IMPLANT
SUT ETHIBOND NAB CT1 #1 30IN (SUTURE) ×14 IMPLANT
SUT VIC AB 1 CTX 36 (SUTURE)
SUT VIC AB 1 CTX36XBRD ANBCTR (SUTURE) ×3 IMPLANT
SUT VIC AB 2-0 CT1 27 (SUTURE) ×6
SUT VIC AB 2-0 CT1 27XBRD (SUTURE) ×2 IMPLANT
SUT VIC AB 2-0 CTX 36 (SUTURE) ×1 IMPLANT
SYRINGE 35CC LL (MISCELLANEOUS) ×2 IMPLANT
TAPE CLOTH SURG 4X10 WHT LF (GAUZE/BANDAGES/DRESSINGS) ×2 IMPLANT
TOWEL OR 17X26 10 PK STRL BLUE (TOWEL DISPOSABLE) ×9 IMPLANT
TRAY FOLEY MTR SLVR 16FR STAT (SET/KITS/TRAYS/PACK) ×3 IMPLANT
WATER STERILE IRR 1000ML POUR (IV SOLUTION) ×3 IMPLANT

## 2018-02-03 NOTE — Brief Op Note (Signed)
02/03/2018  10:14 AM  PATIENT:  Madison Aguilar  52 y.o. female  PRE-OPERATIVE DIAGNOSIS:  OA RIGHT HIP  POST-OPERATIVE DIAGNOSIS:  OA RIGHT HIP  PROCEDURE:  Procedure(s): TOTAL HIP ARTHROPLASTY (Right)  SURGEON:  Surgeon(s) and Role:    * Earlie Server, MD - Primary  PHYSICIAN ASSISTANT: Chriss Czar, PA-C  ASSISTANTS: OR staff x1   ANESTHESIA:   local, spinal and IV sedation  EBL:  700 mL   BLOOD ADMINISTERED:none  DRAINS: none   LOCAL MEDICATIONS USED:  MARCAINE     SPECIMEN:  No Specimen  DISPOSITION OF SPECIMEN:  N/A  COUNTS:  YES  TOURNIQUET:  * No tourniquets in log *  DICTATION: .Other Dictation: Dictation Number unknown  PLAN OF CARE: Admit to inpatient   PATIENT DISPOSITION:  PACU - hemodynamically stable.   Delay start of Pharmacological VTE agent (>24hrs) due to surgical blood loss or risk of bleeding: yes

## 2018-02-03 NOTE — Transfer of Care (Signed)
Immediate Anesthesia Transfer of Care Note  Patient: Madison Aguilar  Procedure(s) Performed: TOTAL HIP ARTHROPLASTY (Right Hip)  Patient Location: PACU  Anesthesia Type:spinal  Level of Consciousness: sedated  Airway & Oxygen Therapy: Patient Spontanous Breathing and Patient connected to face mask oxygen  Post-op Assessment: Report given to RN and Post -op Vital signs reviewed and stable  Post vital signs: Reviewed and stable  Last Vitals:  Vitals Value Taken Time  BP    Temp    Pulse    Resp    SpO2      Last Pain:  Vitals:   02/03/18 0622  TempSrc:   PainSc: 4       Patients Stated Pain Goal: 3 (19/59/74 [id number])  Complications: No apparent anesthesia complications

## 2018-02-03 NOTE — Anesthesia Postprocedure Evaluation (Signed)
Anesthesia Post Note  Patient: Madison Aguilar  Procedure(s) Performed: TOTAL HIP ARTHROPLASTY (Right Hip)     Patient location during evaluation: PACU Anesthesia Type: Spinal Level of consciousness: awake and alert Pain management: pain level controlled Vital Signs Assessment: post-procedure vital signs reviewed and stable Respiratory status: spontaneous breathing and respiratory function stable Cardiovascular status: blood pressure returned to baseline and stable Postop Assessment: spinal receding Anesthetic complications: no    Last Vitals:  Vitals:   02/03/18 1230 02/03/18 1235  BP: (!) 80/51 (!) 81/52  Pulse: (!) 59 60  Resp: 13 14  Temp:    SpO2: 100% 100%    Last Pain:  Vitals:   02/03/18 1200  TempSrc:   PainSc: 0-No pain                 Preslei Blakley DANIEL

## 2018-02-03 NOTE — Op Note (Signed)
NAMECANA, MIGNANO MEDICAL RECORD [id number] ACCOUNT [id number] DATE OF BIRTH:1965/12/27 FACILITY: [facility] LOCATION: [facility] PHYSICIAN:W. Yasmen Cortner JR., MD  OPERATIVE REPORT  DATE OF PROCEDURE:  02/03/2018  PREOPERATIVE DIAGNOSIS:  Severe osteoarthritis, right hip.  OPERATION:  Right total hip replacement (AML size 13.5 mm small stature stem with +1.5 mm neck length ceramic 36 mm hip ball with 44 mm of tissue and 52 mm Pinnacle Gription cup 52 mm outside diameter; Apex screw hole eliminator, Pinnacle ALTRX plus  4/10-degree liner.  SURGEON:  Camelia Phenes. Valeta Harms MD  ASSISTANT:  Jennet Maduro, PA.  ANESTHESIA:  Spinal anesthetic.  ESTIMATED BLOOD LOSS:  700 mL.  DESCRIPTION OF PROCEDURE:  She was given spinal anesthetic and prepped in the lateral position.  Posterior approach to the hip was made.  We split the gluteus maximus and iliotibial band, identified the external rotators and did a T capsulotomy in the  hip after splitting those, I dislocated the hip.  The hip showed significant articular wear on both the femoral and acetabular side.  We did a neck cut about one fingerbreadth above the lesser trochanter followed by progressive reaming and rasping to  accept the 13.5 mm small stature rasp.  It  was a trial with a 0.5 mm under reaming.  Attention was next directed to the acetabulum.  Acetabular retractors were placed carefully posteriorly, anteriorly with wing retractors superiorly.  Remnants of the labrum were removed.  We exposed the acetabulum followed by progressive reaming the  acetabulum for 1 mm underreaming to accept a 52 mm cup.  Final cup was inserted with about 45-50 degrees of abduction, 10-15 degrees of anteversion.  Trial liner was placed.  We trialed off the broach settling on the +1.5 neck length.  Final components  were inserted.  The metal backed cup and then inserted.  We inserted the polyethylene with the lip liner in about the 9 o'clock position for  the right hip and then inserted the stem.  A good snug fit was obtained.  We noted with about 2 mm porous coated  left, there was a small nonpropagated crack in the calcar and therefore, I left the stem slightly proud to prevent any more damage to that area, but did not appear to be a significant structural issue.  We did thoroughly inspect that area.  The  prosthesis and the femur was completely stable.  Again trialed.  We did trial -2 and a +1.5 and I felt that the combination of stability of the 1.5 with the ceramic head option on that was indicated.  Again, excellent stability noted.  Leg lengths  appeared to be equal.  Wound was irrigated.  We then infiltrated Exparel into the wound, 10 mL with Marcaine as well as using topical TXA.  Closure was affected.  We were able to close with a capsulotomy with interrupted Ethibond, running Ethibond on the  fascia, 0 and 2-0 Vicryl and skin clips on the skin.    A lightly compressive sterile dressing and knee immobilizer applied.  Patient taken to recovery room in stable condition.  AN/NUANCE  D:02/03/2018 T:02/03/2018 JOB:[id number]

## 2018-02-03 NOTE — Evaluation (Signed)
Physical Therapy Evaluation Patient Details Name: Madison Aguilar MRN: [id number] DOB: June 08, 1965 Today's Date: 02/03/2018   History of Present Illness  53 yo female s/p R THA posterior approach on 02/03/18. PMH includes OA, plantar fasciitis, GERD, migraine.   Clinical Impression  Pt presents with R hip pain, decreased knowledge and application of posterior hip precautions, decreased safety awareness, orthostasis with positional changes, and difficulty performing mobility tasks. Pt to benefit from acute PT to address deficits. Pt able to take 2 steps at EOB with min guard assist, limited by low BP 89/39. Pt requires frequent reminders to maintain posterior hip precautions, as pt attempting to cross LEs and turn toes inwards during session. PT reinforced posterior precautions throughout session/mobility. Pt educated on quad sets (5-10/hour), ankle pumps (20/hour), and heel slides <90* hip flexion (5-10/hour) to perform this afternoon/evening to lessen stiffness and increase circulation, to pt's tolerance and limited by pain. PT to progress mobility as tolerated, and will continue to follow acutely.      Follow Up Recommendations Follow surgeon's recommendation for DC plan and follow-up therapies;Supervision for mobility/OOB(HHPT )    Equipment Recommendations  Rolling walker with 5" wheels    Recommendations for Other Services       Precautions / Restrictions Precautions Precautions: Posterior Hip Precaution Booklet Issued: Yes (comment) Precaution Comments: handout administered, verbally reviewed and demonstrated no hip flexion >90*, no adduction/no crossing legs, and no hip IR. Verbal reinforcements throughout session  Restrictions Weight Bearing Restrictions: No Other Position/Activity Restrictions: WBAT       Mobility  Bed Mobility Overal bed mobility: Needs Assistance Bed Mobility: Supine to Sit;Sit to Supine     Supine to sit: Min assist;HOB elevated Sit to supine: Min  assist;+2 for physical assistance;HOB elevated   General bed mobility comments: Min assist for supine to sit for RLE management, maintaining hip precautions via verbal and tactile reinforcement, and scooting to EOB. Pt exited the bed towards the R to be moving RLE into hip abduction. Pt reporting lightheadedness upon sitting EOB with BP 86/52 with pulse of 65 bpm. Pt's baseline BP today close to this. Pt asymptomatic after short time sitting EOB.   Transfers Overall transfer level: Needs assistance Equipment used: Rolling walker (2 wheeled) Transfers: Sit to/from Stand Sit to Stand: Min guard         General transfer comment: Min guard for safety, pt requiring verbal reinforcements for hip in neutral rotation (pt with tendency towards IR, hip in neutral to ER reinforced several times during session). Upon standing, BP 89/39 with pulse of 78 bpm. Pt asymptomatic, but PT deferred ambulation. Pt took two steps EOB, per her request.    Ambulation/Gait Ambulation/Gait assistance: (NT - hypotension )              Stairs            Wheelchair Mobility    Modified Rankin (Stroke Patients Only)       Balance Overall balance assessment: Mild deficits observed, not formally tested                                           Pertinent Vitals/Pain Pain Assessment: 0-10 Pain Score: 3  Pain Location: R posterior hip  Pain Descriptors / Indicators: Sore;Aching Pain Intervention(s): Limited activity within patient's tolerance;Repositioned;Ice applied;Monitored during session    Home Living Family/patient expects to be discharged to:: Private  residence Living Arrangements: Spouse/significant other Available Help at Discharge: Family;Available 24 hours/day(pt's husband is retired, available all the time ) Type of Home: House Home Access: Stairs to enter Entrance Stairs-Rails: Left Entrance Stairs-Number of Steps: 3 Home Layout: Two level;Able to live on main  level with bedroom/bathroom Home Equipment: Walker - 4 wheels      Prior Function Level of Independence: Independent               Hand Dominance   Dominant Hand: Right    Extremity/Trunk Assessment   Upper Extremity Assessment Upper Extremity Assessment: Overall WFL for tasks assessed    Lower Extremity Assessment Lower Extremity Assessment: RLE deficits/detail RLE Deficits / Details: suspected post-surgical hip weakness; able to perform ankle pumps, quad sets, assisted heel slides  RLE Sensation: WNL    Cervical / Trunk Assessment Cervical / Trunk Assessment: Normal  Communication   Communication: No difficulties  Cognition Arousal/Alertness: Awake/alert Behavior During Therapy: WFL for tasks assessed/performed Overall Cognitive Status: Impaired/Different from baseline Area of Impairment: Memory;Following commands;Problem solving                     Memory: Decreased short-term memory Following Commands: Follows one step commands inconsistently;Follows one step commands with increased time     Problem Solving: Difficulty sequencing;Requires verbal cues;Requires tactile cues General Comments: suspected areas of impairment due to medications, as family state pain medications make pt "loopy"       General Comments      Exercises     Assessment/Plan    PT Assessment Patient needs continued PT services  PT Problem List Decreased strength;Decreased mobility;Decreased knowledge of precautions;Decreased safety awareness;Decreased activity tolerance;Decreased balance;Decreased knowledge of use of DME       PT Treatment Interventions DME instruction;Therapeutic activities;Gait training;Therapeutic exercise;Patient/family education;Stair training;Balance training;Functional mobility training    PT Goals (Current goals can be found in the Care Plan section)  Acute Rehab PT Goals Patient Stated Goal: I want to walk  PT Goal Formulation: With patient Time  For Goal Achievement: 02/10/18 Potential to Achieve Goals: Good    Frequency 7X/week   Barriers to discharge        Co-evaluation               AM-PAC PT "6 Clicks" Mobility  Outcome Measure Help needed turning from your back to your side while in a flat bed without using bedrails?: A Little Help needed moving from lying on your back to sitting on the side of a flat bed without using bedrails?: A Little Help needed moving to and from a bed to a chair (including a wheelchair)?: A Little Help needed standing up from a chair using your arms (e.g., wheelchair or bedside chair)?: A Little Help needed to walk in hospital room?: A Little Help needed climbing 3-5 steps with a railing? : A Lot 6 Click Score: 17    End of Session Equipment Utilized During Treatment: Gait belt Activity Tolerance: Treatment limited secondary to medical complications (Comment)(hypotension) Patient left: in bed;with bed alarm set;with call bell/phone within reach;with family/visitor present;with SCD's reapplied Nurse Communication: Mobility status PT Visit Diagnosis: Other abnormalities of gait and mobility (R26.89)    Time: 4081-4481 PT Time Calculation (min) (ACUTE ONLY): 32 min   Charges:   PT Evaluation $PT Eval Low Complexity: 1 Low PT Treatments $Therapeutic Activity: 8-22 mins       Srihan Brutus Conception Chancy, PT Acute Rehabilitation Services Pager [phone]  Office [phone]  Julien Girt  02/03/2018, 5:25 PM

## 2018-02-03 NOTE — Anesthesia Procedure Notes (Signed)
Spinal  Patient location during procedure: OR Start time: 02/03/2018 7:34 AM End time: 02/03/2018 7:37 AM Staffing Anesthesiologist: Duane Boston, MD Resident/CRNA: Lind Covert, CRNA Performed: resident/CRNA  Preanesthetic Checklist Completed: patient identified, site marked, surgical consent, pre-op evaluation, timeout performed, IV checked, risks and benefits discussed and monitors and equipment checked Spinal Block Patient position: sitting Prep: DuraPrep Patient monitoring: heart rate, continuous pulse ox, blood pressure and cardiac monitor Approach: midline Location: L3-4 Injection technique: single-shot Needle Needle type: Pencan  Needle gauge: 24 G Needle length: 10 cm Needle insertion depth: 7 cm Assessment Sensory level: T6 Additional Notes Timeout performed. SAB kit date checked. SAB without difficulty

## 2018-02-03 NOTE — Discharge Instructions (Signed)

## 2018-02-03 NOTE — Interval H&P Note (Signed)
History and Physical Interval Note:  02/03/2018 7:22 AM  Madison Aguilar  has presented today for surgery, with the diagnosis of OA RIGHT HIP  The various methods of treatment have been discussed with the patient and family. After consideration of risks, benefits and other options for treatment, the patient has consented to  Procedure(s): TOTAL HIP ARTHROPLASTY (Right) as a surgical intervention .  The patient's history has been reviewed, patient examined, no change in status, stable for surgery.  I have reviewed the patient's chart and labs.  Questions were answered to the patient's satisfaction.     Yvette Rack

## 2018-02-04 LAB — BASIC METABOLIC PANEL
Anion gap: 4 — ABNORMAL LOW (ref 5–15)
BUN: 6 mg/dL (ref 6–20)
CALCIUM: 8.5 mg/dL — AB (ref 8.9–10.3)
CO2: 26 mmol/L (ref 22–32)
Chloride: 111 mmol/L (ref 98–111)
Creatinine, Ser: 0.79 mg/dL (ref 0.44–1.00)
GFR calc Af Amer: >60 mL/min (ref >60)
GFR calc non Af Amer: >60 mL/min (ref >60)
Glucose, Bld: 106 mg/dL — ABNORMAL HIGH (ref 70–99)
Potassium: 3.9 mmol/L (ref 3.5–5.1)
Sodium: 141 mmol/L (ref 135–145)

## 2018-02-04 LAB — CBC
HEMATOCRIT: 29.2 % — AB (ref 36.0–46.0)
Hemoglobin: 9.2 g/dL — ABNORMAL LOW (ref 12.0–15.0)
MCH: 31.7 pg (ref 26.0–34.0)
MCHC: 31.5 g/dL (ref 30.0–36.0)
MCV: 100.7 fL — ABNORMAL HIGH (ref 80.0–100.0)
Platelets: 238 10*3/uL (ref 150–400)
RBC: 2.9 MIL/uL — ABNORMAL LOW (ref 3.87–5.11)
RDW: 13.6 % (ref 11.5–15.5)
WBC: 7.5 10*3/uL (ref 4.0–10.5)
nRBC: 0 % (ref 0.0–0.2)

## 2018-02-04 MED ORDER — ACETAMINOPHEN 500 MG PO TABS
500.0000 mg | ORAL_TABLET | Freq: Four times a day (QID) | ORAL | Status: DC | PRN
  Administered 2018-02-04 – 2018-02-05 (×2): 1000 mg via ORAL
  Filled 2018-02-04 (×4): qty 2

## 2018-02-04 MED ORDER — TRAMADOL HCL 50 MG PO TABS
50.0000 mg | ORAL_TABLET | Freq: Four times a day (QID) | ORAL | Status: DC | PRN
  Administered 2018-02-04: 50 mg via ORAL
  Filled 2018-02-04: qty 2

## 2018-02-04 MED ORDER — TRAMADOL HCL 50 MG PO TABS: 50.0000 mg | ORAL_TABLET | Freq: Four times a day (QID) | ORAL | 0 refills | Status: DC | PRN

## 2018-02-04 NOTE — Progress Notes (Signed)
Physical Therapy Treatment Patient Details Name: Madison Aguilar MRN: [id number] DOB: 1965-06-20 Today's Date: 02/04/2018    History of Present Illness 52 yo female s/p R THA posterior approach on 02/03/18. PMH includes OA, plantar fasciitis, GERD, migraine.     PT Comments    Patient progressing with hallway ambulation this session despite continued mild lightheadedness.  Reported mild nausea, then did vomit x 1 feeling improved after.  Feel she will need further skilled PT prior to d/c home.    Follow Up Recommendations  Follow surgeon's recommendation for DC plan and follow-up therapies;Supervision for mobility/OOB     Equipment Recommendations  Rolling walker with 5" wheels    Recommendations for Other Services       Precautions / Restrictions Precautions Precautions: Posterior Hip Precaution Comments: reveiwed precautions initially and during session Restrictions Weight Bearing Restrictions: No Other Position/Activity Restrictions: WBAT     Mobility  Bed Mobility Overal bed mobility: Needs Assistance Bed Mobility: Supine to Sit     Supine to sit: Min guard;HOB elevated     General bed mobility comments: assist for R LE, BP large cuff 87/58, small cuff 93/64  Transfers Overall transfer level: Needs assistance Equipment used: Rolling walker (2 wheeled) Transfers: Sit to/from Stand Sit to Stand: Min guard         General transfer comment: cues for hand placement, assist for balance  Ambulation/Gait Ambulation/Gait assistance: Supervision Gait Distance (Feet): 140 Feet Assistive device: None Gait Pattern/deviations: Step-through pattern;Step-to pattern;Decreased stride length;Antalgic     General Gait Details: cues for sequence with walker, assist for safety, chair following and one seated rest, then pt nauseated and sat in chair and vomited, RN aware, pt reported felt better afterward   Stairs             Wheelchair Mobility    Modified Rankin  (Stroke Patients Only)       Balance Overall balance assessment: Mild deficits observed, not formally tested                                          Cognition Arousal/Alertness: Awake/alert Behavior During Therapy: WFL for tasks assessed/performed Overall Cognitive Status: Within Functional Limits for tasks assessed                                        Exercises Total Joint Exercises Ankle Circles/Pumps: AROM;10 reps;Both;Supine Quad Sets: AROM;10 reps;Both;Supine Short Arc Quad: AROM;10 reps;Right;Supine Heel Slides: AROM;AAROM;10 reps;Supine;Both Hip ABduction/ADduction: AAROM;10 reps;Right;Supine    General Comments        Pertinent Vitals/Pain Pain Score: 4  Pain Location: R posterior hip  Pain Descriptors / Indicators: Sore;Aching Pain Intervention(s): Monitored during session;Repositioned;Ice applied    Home Living                      Prior Function            PT Goals (current goals can now be found in the care plan section) Progress towards PT goals: Progressing toward goals    Frequency    7X/week      PT Plan Current plan remains appropriate    Co-evaluation              AM-PAC PT "6 Clicks" Mobility   Outcome Measure  Help needed turning from your back to your side while in a flat bed without using bedrails?: A Little Help needed moving from lying on your back to sitting on the side of a flat bed without using bedrails?: A Little Help needed moving to and from a bed to a chair (including a wheelchair)?: A Little Help needed standing up from a chair using your arms (e.g., wheelchair or bedside chair)?: A Little Help needed to walk in hospital room?: A Little Help needed climbing 3-5 steps with a railing? : A Lot 6 Click Score: 17    End of Session Equipment Utilized During Treatment: Gait belt Activity Tolerance: Patient tolerated treatment well(despite N&V) Patient left: with call  bell/phone within reach;in chair Nurse Communication: Other (comment)(wantes Tylenol and had N&V during session) PT Visit Diagnosis: Other abnormalities of gait and mobility (R26.89)     Time: 1829-9371 PT Time Calculation (min) (ACUTE ONLY): 35 min  Charges:  $Gait Training: 8-22 mins $Therapeutic Exercise: 8-22 mins                     Madison Aguilar, [place] [phone] 02/04/2018    Madison Aguilar 02/04/2018, 11:59 AM

## 2018-02-04 NOTE — Progress Notes (Signed)
Occupational Therapy Evaluation Patient Details Name: Madison Aguilar MRN: [id number] DOB: April 24, 1965 Today's Date: 02/04/2018    History of Present Illness 52 yo female s/p R THA posterior approach on 02/03/18. PMH includes OA, plantar fasciitis, GERD, migraine.    Clinical Impression   Patient presents to OT with decreased ADL independence due to the deficits listed below. She will benefit from skilled OT to maximize function and to facilitate a safe discharge.    Follow Up Recommendations  Supervision/Assistance - 24 hour    Equipment Recommendations  3 in 1 bedside commode;Other (comment)(AE)    Recommendations for Other Services PT consult     Precautions / Restrictions Precautions Precautions: Posterior Hip Precaution Comments: reveiwed precautions initially and during session Restrictions Weight Bearing Restrictions: No Other Position/Activity Restrictions: WBAT       Mobility Bed Mobility Overal bed mobility: Needs Assistance Bed Mobility: Supine to Sit     Supine to sit: Min guard;HOB elevated     General bed mobility comments: assist for R LE, BP large cuff 87/58, small cuff 93/64  Transfers Overall transfer level: Needs assistance Equipment used: Rolling walker (2 wheeled) Transfers: Sit to/from Stand Sit to Stand: Min guard         General transfer comment: cues for hand placement, assist for balance    Balance Overall balance assessment: Mild deficits observed, not formally tested                                         ADL either performed or assessed with clinical judgement   ADL Overall ADL's : Needs assistance/impaired                         Toilet Transfer: Min guard;Ambulation;BSC;RW   Toileting- Water quality scientist and Hygiene: Min guard;Adhering to hip precautions;Sit to/from stand   Tub/ Shower Transfer: Walk-in shower;Min guard;Anterior/posterior;Rolling walker   Functional mobility during ADLs:  Min guard;Rolling walker General ADL Comments: Needed min cues for adhering to posterior hip precautions during mobility.     Vision         Perception     Praxis      Pertinent Vitals/Pain Pain Assessment: 0-10 Pain Score: 4  Pain Location: R posterior hip  Pain Descriptors / Indicators: Sore;Aching Pain Intervention(s): RN gave pain meds during session     Hand Dominance Right   Extremity/Trunk Assessment Upper Extremity Assessment Upper Extremity Assessment: Overall WFL for tasks assessed   Lower Extremity Assessment Lower Extremity Assessment: Defer to PT evaluation   Cervical / Trunk Assessment Cervical / Trunk Assessment: Normal   Communication Communication Communication: No difficulties   Cognition Arousal/Alertness: Awake/alert Behavior During Therapy: WFL for tasks assessed/performed Overall Cognitive Status: Within Functional Limits for tasks assessed                                     General Comments       Exercises    Shoulder Instructions      Home Living Family/patient expects to be discharged to:: Private residence Living Arrangements: Spouse/significant other Available Help at Discharge: Family;Available 24 hours/day Type of Home: House Home Access: Stairs to enter [employer] of Steps: 3 Entrance Stairs-Rails: Left Home Layout: Two level;Able to live on main level with bedroom/bathroom  Bathroom Shower/Tub: Occupational psychologist: Handicapped height Bathroom Accessibility: Yes How Accessible: Accessible via walker Home Equipment: [place] - 4 wheels          Prior Functioning/Environment Level of Independence: Independent                 OT Problem List: Decreased strength;Decreased activity tolerance;Decreased knowledge of use of DME or AE;Decreased knowledge of precautions;Pain      OT Treatment/Interventions: Self-care/ADL training;DME and/or AE instruction;Therapeutic  activities;Patient/family education    OT Goals(Current goals can be found in the care plan section) Acute Rehab OT Goals Patient Stated Goal: I want to walk  OT Goal Formulation: With patient Time For Goal Achievement: 02/18/18 Potential to Achieve Goals: Good  OT Frequency: Min 2X/week   Barriers to D/C:            Co-evaluation              AM-PAC OT "6 Clicks" Daily Activity     Outcome Measure Help from another person eating meals?: None Help from another person taking care of personal grooming?: A Little Help from another person toileting, which includes using toliet, bedpan, or urinal?: A Little Help from another person bathing (including washing, rinsing, drying)?: A Lot Help from another person to put on and taking off regular upper body clothing?: None Help from another person to put on and taking off regular lower body clothing?: A Lot 6 Click Score: 18   End of Session Equipment Utilized During Treatment: Gait belt;Rolling walker Nurse Communication: Mobility status  Activity Tolerance: Patient tolerated treatment well Patient left: in bed;with nursing/sitter in room;with family/visitor present  OT Visit Diagnosis: Unsteadiness on feet (R26.81)                Time: 5993-5701 OT Time Calculation (min): 38 min Charges:  OT General Charges $OT Visit: 1 Visit OT Evaluation $OT Eval Low Complexity: 1 Low OT Treatments $Self Care/Home Management : 23-37 mins    Madison Aguilar 02/04/2018, 2:28 PM

## 2018-02-04 NOTE — Progress Notes (Signed)
Physical Therapy Treatment Patient Details Name: Madison Aguilar MRN: [id number] DOB: 1966/02/22 Today's Date: 02/04/2018    History of Present Illness 52 yo female s/p R THA posterior approach on 02/03/18. PMH includes OA, plantar fasciitis, GERD, migraine.     PT Comments    Patient progressing slowly due to nausea, lightheaded and this pm more pallor noted with mild drop in BP in standing.  Discussed with RN maybe to get TED's and again maybe change pain medication regimen.  Able to negotiate steps appropriate for home entry with spouse assist.  Feel should be able to d/c tomorrow after another PT session.    Follow Up Recommendations  Follow surgeon's recommendation for DC plan and follow-up therapies;Supervision for mobility/OOB     Equipment Recommendations  Rolling walker with 5" wheels    Recommendations for Other Services       Precautions / Restrictions Precautions Precautions: Posterior Hip Precaution Comments: reveiwed precautions initially and during session Restrictions Weight Bearing Restrictions: No Other Position/Activity Restrictions: WBAT     Mobility  Bed Mobility Overal bed mobility: Needs Assistance Bed Mobility: Sit to Supine       Sit to supine: Min assist   General bed mobility comments: assist for R LE  Transfers Overall transfer level: Needs assistance Equipment used: Rolling walker (2 wheeled) Transfers: Sit to/from Stand Sit to Stand: Supervision         General transfer comment: cues for hand placement, pt trying to use walker to stand  Ambulation/Gait Ambulation/Gait assistance: Min guard;Supervision Gait Distance (Feet): 80 Feet Assistive device: Rolling walker (2 wheeled) Gait Pattern/deviations: Step-to pattern;Step-through pattern;Decreased stride length;Antalgic     General Gait Details: adjusted height of walker for comfort/fit, still with nausea this session and noted pallor.  BP seated 103/68, standing 98/58 RN aware and  recommended TED's   Stairs Stairs: Yes Stairs assistance: Min assist Stair Management: Step to pattern;Backwards;With walker Number of Stairs: 2 General stair comments: spouse present and assisted to hold walker   Wheelchair Mobility    Modified Rankin (Stroke Patients Only)       Balance Overall balance assessment: Mild deficits observed, not formally tested                                          Cognition Arousal/Alertness: Awake/alert Behavior During Therapy: WFL for tasks assessed/performed Overall Cognitive Status: Within Functional Limits for tasks assessed                                        Exercises      General Comments General comments (skin integrity, edema, etc.): issued HEP and reviewed with pt      Pertinent Vitals/Pain Pain Assessment: Faces Pain Score: 4  Faces Pain Scale: Hurts little more Pain Location: R posterior hip  Pain Descriptors / Indicators: Sore;Aching Pain Intervention(s): Monitored during session;Repositioned;Ice applied;Premedicated before session    [place] expects to be discharged to:: Private residence Living Arrangements: Spouse/significant other Available Help at Discharge: Family;Available 24 hours/day Type of Home: House Home Access: Stairs to enter Entrance Stairs-Rails: Left Home Layout: Two level;Able to live on main level with bedroom/bathroom Home Equipment: Walker - 4 wheels      Prior Function Level of Independence: Independent  PT Goals (current goals can now be found in the care plan section) Acute Rehab PT Goals Patient Stated Goal: I want to walk  Progress towards PT goals: Progressing toward goals    Frequency    7X/week      PT Plan Current plan remains appropriate    Co-evaluation              AM-PAC PT "6 Clicks" Mobility   Outcome Measure  Help needed turning from your back to your side while in a flat bed without  using bedrails?: A Little Help needed moving from lying on your back to sitting on the side of a flat bed without using bedrails?: A Little Help needed moving to and from a bed to a chair (including a wheelchair)?: A Little Help needed standing up from a chair using your arms (e.g., wheelchair or bedside chair)?: A Little Help needed to walk in hospital room?: A Little Help needed climbing 3-5 steps with a railing? : A Little 6 Click Score: 18    End of Session Equipment Utilized During Treatment: Gait belt Activity Tolerance: Treatment limited secondary to medical complications (Comment)(nausea, light headed) Patient left: in bed;with call bell/phone within reach;with family/visitor present   PT Visit Diagnosis: Other abnormalities of gait and mobility (R26.89)     Time: 1415-1441 PT Time Calculation (min) (ACUTE ONLY): 26 min  Charges:  $Gait Training: 8-22 mins $Therapeutic Activity: 8-22 mins                     Magda Kiel, PT Acute Rehabilitation Services [phone] 02/04/2018    Madison Aguilar 02/04/2018, 4:10 PM

## 2018-02-04 NOTE — Progress Notes (Addendum)
Patient was seen and evaluated by Dr French Ana and planning to go home tomorrow. Patient had nausea and Dr Lorenz Coaster changed Rx to Tramadol. Dry dressing PRN Patient is WBAT Will send in Rx for Tramadol to [facility]  Plan for DC home tomorrow Continue PT  Continue to advance diet

## 2018-02-04 NOTE — Care Management Note (Signed)
Case Management Note  Patient Details  Name: [name] MRN: [id number] Date of Birth: 1966-01-27  Subjective/Objective:   S/p R THA                 Action/Plan: NCM spoke to pt and offered choice HH/CMS list provided/placed on chart. Pt agreeable to [facility] for [place]. Contacted [facility] for RW and 3n1 bedside, delivered to room. Family will assist at home.   Expected Discharge Date:                 Expected Discharge Plan:  [place]  In-House Referral:  NA  Discharge planning Services  CM Consult  Post Acute Care Choice:  Home Health Choice offered to:  Patient  DME Arranged:  3-N-1, Walker rolling DME Agency:  [place]:  PT Susan Moore:  [facility]  Status of Service:  Completed, signed off  If discussed at [employer], dates discussed:    Additional Comments:  Erenest Rasher, RN 02/04/2018, 1:46 PM

## 2018-02-05 LAB — CBC
HCT: 27.8 % — ABNORMAL LOW (ref 36.0–46.0)
Hemoglobin: 8.7 g/dL — ABNORMAL LOW (ref 12.0–15.0)
MCH: 31.1 pg (ref 26.0–34.0)
MCHC: 31.3 g/dL (ref 30.0–36.0)
MCV: 99.3 fL (ref 80.0–100.0)
Platelets: 253 10*3/uL (ref 150–400)
RBC: 2.8 MIL/uL — ABNORMAL LOW (ref 3.87–5.11)
RDW: 13.6 % (ref 11.5–15.5)
WBC: 9.1 10*3/uL (ref 4.0–10.5)
nRBC: 0 % (ref 0.0–0.2)

## 2018-02-05 NOTE — Progress Notes (Addendum)
Patient ID: Madison Aguilar, female   DOB: 11-18-1965, 52 y.o.   MRN: [id number]     Subjective:  Patient reports pain as mild.  Patient in the chair and in no acute distress denies any CP or SOB  Objective:   VITALS:   Vitals:   02/04/18 1347 02/04/18 2123 02/05/18 0513 02/05/18 0900  BP: 100/62 (!) 104/56 (!) 104/56   Pulse: 62 61 (!) 59   Resp: 15 16 16    Temp: 98.6 F (37 C) (!) 92.2 F (33.4 C) 99.6 F (37.6 C) 98.9 F (37.2 C)  TempSrc: Oral Oral Oral Oral  SpO2: 100% 98% 99%   Weight:      Height:        ABD soft Sensation intact distally Dorsiflexion/Plantar flexion intact Incision: dressing C/D/I and no drainage   Lab Results  Component Value Date   WBC 9.1 02/05/2018   HGB 8.7 (L) 02/05/2018   HCT 27.8 (L) 02/05/2018   MCV 99.3 02/05/2018   PLT 253 02/05/2018   BMET    Component Value Date/Time   NA 141 02/04/2018 0356   NA 143 09/13/2017 0756   K 3.9 02/04/2018 0356   CL 111 02/04/2018 0356   CO2 26 02/04/2018 0356   GLUCOSE 106 (H) 02/04/2018 0356   BUN 6 02/04/2018 0356   BUN 7 09/13/2017 0756   CREATININE 0.79 02/04/2018 0356   CALCIUM 8.5 (L) 02/04/2018 0356   GFRNONAA >60 02/04/2018 0356   GFRAA >60 02/04/2018 0356     Assessment/Plan: 2 Days Post-Op   Active Problems:   Primary localized osteoarthritis of right hip   Advance diet Up with therapy DC home WBAT Dry dressing PRN Follow up with Dr French Ana  Lunette Stands 02/05/2018, 10:14 AM  Reviewed, discussed, and agree with above.  Discharge home today.  Marchia Bond, MD Cell [phone]

## 2018-02-05 NOTE — Progress Notes (Signed)
Occupational Therapy Treatment Patient Details Name: Madison Aguilar MRN: [id number] DOB: [birth year] Today's Date: 02/05/2018    History of present illness 52 yo female s/p R THA posterior approach on 02/03/18. PMH includes OA, plantar fasciitis, GERD, migraine.    OT comments  Pt progress toward goals, with improved pain this date. Provided instruction on LB ADL equipment for pt to complete ind BADL while follow precautions. Pt provided return demonstration with reacher to don pants at set up A level. Min A provided to pull pants over hips/dressing, pt standing at supervision level of assist. Pt demonstrated donning socks with sock aide at set up A. Provided handout of LB ADL equipment for pt to acquire at d/c. Plans for d/c today.   Follow Up Recommendations  Supervision - Intermittent    Equipment Recommendations  3 in 1 bedside commode;Other (comment)(LB ADL equipment)    Recommendations for Other Services      Precautions / Restrictions Precautions Precautions: Posterior Hip Precaution Booklet Issued: Yes (comment) Precaution Comments: reveiwed precautions initially and during session Restrictions Weight Bearing Restrictions: No Other Position/Activity Restrictions: WBAT        Mobility Bed Mobility               General bed mobility comments: recieved in chair  Transfers Overall transfer level: Needs assistance Equipment used: Rolling walker (2 wheeled) Transfers: Sit to/from Stand Sit to Stand: Supervision         General transfer comment: stood without walker to pull up pants    Balance Overall balance assessment: Mild deficits observed, not formally tested                                         ADL either performed or assessed with clinical judgement   ADL Overall ADL's : Needs assistance/impaired             Lower Body Bathing: Minimal assistance;Sit to/from stand;Cueing for compensatory techniques;With adaptive  equipment Lower Body Bathing Details (indicate cue type and reason): demonstrating with long handled sponge     Lower Body Dressing: Minimal assistance;Sit to/from stand;Cueing for compensatory techniques;With adaptive equipment Lower Body Dressing Details (indicate cue type and reason): used long handled reacher to don pants; sock aide to don socks               General ADL Comments: progressing toward goals, demonstrated LB ADL equipment to follow precautions     Vision Baseline Vision/History: No visual deficits Patient Visual Report: No change from baseline     Perception     Praxis      Cognition Arousal/Alertness: Awake/alert Behavior During Therapy: WFL for tasks assessed/performed Overall Cognitive Status: Within Functional Limits for tasks assessed                                          Exercises     Shoulder Instructions       General Comments      Pertinent Vitals/ Pain       Pain Assessment: Faces Faces Pain Scale: Hurts little more Pain Location: R hip Pain Descriptors / Indicators: Sore;Aching Pain Intervention(s): Limited activity within patient's tolerance;Monitored during session;Repositioned;Premedicated before session  Home Living  Prior Functioning/Environment              Frequency  Min 2X/week        Progress Toward Goals  OT Goals(current goals can now be found in the care plan section)  Progress towards OT goals: Progressing toward goals  Acute Rehab OT Goals Patient Stated Goal: to go home OT Goal Formulation: With patient Time For Goal Achievement: 02/18/18 Potential to Achieve Goals: Good  Plan Discharge plan needs to be updated;Frequency remains appropriate    Co-evaluation                 AM-PAC OT "6 Clicks" Daily Activity     Outcome Measure   Help from another person eating meals?: None Help from another person taking  care of personal grooming?: A Little Help from another person toileting, which includes using toliet, bedpan, or urinal?: A Little Help from another person bathing (including washing, rinsing, drying)?: A Little Help from another person to put on and taking off regular upper body clothing?: None Help from another person to put on and taking off regular lower body clothing?: A Little 6 Click Score: 20    End of Session    OT Visit Diagnosis: Other abnormalities of gait and mobility (R26.89);Pain Pain - Right/Left: Right Pain - part of body: Hip   Activity Tolerance Patient tolerated treatment well   Patient Left in bed;with family/visitor present;with call bell/phone within reach   Nurse Communication          Time: 7371-0626 OT Time Calculation (min): 15 min  Charges: OT General Charges $OT Visit: 1 Visit OT Treatments $Self Care/Home Management : 8-22 mins  Zenovia Jarred, MSOT, OTR/L [place] Office: [phone]  Zenovia Jarred 02/05/2018, 11:33 AM

## 2018-02-05 NOTE — Discharge Summary (Signed)
Physician Discharge Summary  Patient ID: Madison Aguilar MRN: [id number] DOB/AGE: April 02, 1965 52 y.o.  Admit date: 02/03/2018 Discharge date: 02/05/2018  Admission Diagnoses:  Right hip primary localized osteoarthritis  Discharge Diagnoses:  Active Problems:   Primary localized osteoarthritis of right hip   Past Medical History:  Diagnosis Date  . Arthritis    Right hip  . Diverticulosis 09/2010   Left side, noted on colonoscopy  . GERD (gastroesophageal reflux disease)   . History of anemia   . History of colon polyps 09/2010  . Hypothyroid   . Low blood pressure   . Migraine headache   . Plantar fasciitis   . Tinnitus, bilateral   . Varicose vein of leg     Surgeries: Procedure(s): TOTAL HIP ARTHROPLASTY on 02/03/2018   Consultants (if any):   Discharged Condition: Improved  Hospital Course: Madison Aguilar is an 52 y.o. female who was admitted 02/03/2018 with a diagnosis of <principal problem not specified> and went to the operating room on 02/03/2018 and underwent the above named procedures.    She was given perioperative antibiotics:  Anti-infectives (From admission, onward)   Start     Dose/Rate Route Frequency Ordered Stop   02/03/18 1500  ceFAZolin (ANCEF) IVPB 1 g/50 mL premix     1 g 100 mL/hr over 30 Minutes Intravenous Every 6 hours 02/03/18 1411 02/03/18 2115   02/03/18 1415  ceFAZolin (ANCEF) IVPB 1 g/50 mL premix  Status:  Discontinued     1 g 100 mL/hr over 30 Minutes Intravenous Every 6 hours 02/03/18 1404 02/03/18 1411   02/03/18 0600  ceFAZolin (ANCEF) IVPB 2g/100 mL premix     2 g 200 mL/hr over 30 Minutes Intravenous On call to O.R. 02/03/18 0536 02/03/18 0737   02/03/18 0600  ceFAZolin (ANCEF) IVPB 2g/100 mL premix  Status:  Discontinued     2 g 200 mL/hr over 30 Minutes Intravenous On call to O.R. 02/03/18 4742 02/03/18 0537    .  She was given sequential compression devices, early ambulation, and aspirin for DVT prophylaxis.  She  benefited maximally from the hospital stay and there were no complications.    Recent vital signs:  Vitals:   02/05/18 0513 02/05/18 0900  BP: (!) 104/56   Pulse: (!) 59   Resp: 16   Temp: 99.6 F (37.6 C) 98.9 F (37.2 C)  SpO2: 99%     Recent laboratory studies:  Lab Results  Component Value Date   HGB 8.7 (L) 02/05/2018   HGB 9.2 (L) 02/04/2018   HGB 13.5 01/27/2018   Lab Results  Component Value Date   WBC 9.1 02/05/2018   PLT 253 02/05/2018   Lab Results  Component Value Date   INR 0.91 01/27/2018   Lab Results  Component Value Date   NA 141 02/04/2018   K 3.9 02/04/2018   CL 111 02/04/2018   CO2 26 02/04/2018   BUN 6 02/04/2018   CREATININE 0.79 02/04/2018   GLUCOSE 106 (H) 02/04/2018    Discharge Medications:   Allergies as of 02/05/2018   No Known Allergies     Medication List    STOP taking these medications   amoxicillin-clavulanate 875-125 MG tablet Commonly known as:  AUGMENTIN   etodolac 400 MG tablet Commonly known as:  LODINE   fluconazole 150 MG tablet Commonly known as:  DIFLUCAN   ibuprofen 200 MG tablet Commonly known as:  ADVIL,MOTRIN   omeprazole 20 MG capsule Commonly known as:  PRILOSEC     TAKE these medications   aspirin EC 81 MG tablet Take 1 tablet (81 mg total) by mouth 2 (two) times daily.   levothyroxine 75 MCG tablet Commonly known as:  SYNTHROID, LEVOTHROID Take 1 tablet (75 mcg total) by mouth daily. What changed:  when to take this   methocarbamol 500 MG tablet Commonly known as:  ROBAXIN Take 1 tablet (500 mg total) by mouth every 6 (six) hours as needed for muscle spasms.   oxyCODONE 5 MG immediate release tablet Commonly known as:  ROXICODONE Take 1 tab po q4-6hrs prn pain, may need 1-2 first couple weeks.   traMADol 50 MG tablet Commonly known as:  ULTRAM Take 1 tablet (50 mg total) by mouth every 6 (six) hours as needed.            Durable Medical Equipment  (From admission, onward)          Start     Ordered   02/03/18 1405  DME Walker rolling  Once    Question:  Patient needs a walker to treat with the following condition  Answer:  Primary localized osteoarthritis of right hip   02/03/18 1404   02/03/18 1405  DME 3 n 1  Once     02/03/18 1404          Diagnostic Studies: Dg Chest 2 View  Result Date: 01/27/2018 CLINICAL DATA:  Pre op hip arthroplasty, pt denies any chest complaints today. Hx smoker EXAM: CHEST - 2 VIEW COMPARISON:  Chest x-ray dated 05/17/2016. FINDINGS: The heart size and mediastinal contours are within normal limits. Both lungs are clear. The visualized skeletal structures are unremarkable. IMPRESSION: No active cardiopulmonary disease. Electronically Signed   By: Franki Cabot M.D.   On: 01/27/2018 13:33   Dg Pelvis Portable  Result Date: 02/03/2018 CLINICAL DATA:  Right hip replacement EXAM: PORTABLE PELVIS 1-2 VIEWS COMPARISON:  None. FINDINGS: Total hip arthroplasty on the right has a good appearance. No radiographically detectable complication. IMPRESSION: Good appearance following total hip arthroplasty on the right. Electronically Signed   By: Nelson Chimes M.D.   On: 02/03/2018 13:18   Dg Hip Port Unilat With Pelvis 1v Right  Result Date: 02/03/2018 CLINICAL DATA:  Total hip arthroplasty. EXAM: DG HIP (WITH OR WITHOUT PELVIS) 1V PORT RIGHT COMPARISON:  None. FINDINGS: Total hip arthroplasty has a good appearance. Components appear well positioned. No radiographically detectable complication. IMPRESSION: Good appearance following total hip arthroplasty. Electronically Signed   By: Nelson Chimes M.D.   On: 02/03/2018 13:19    Disposition: Discharge disposition: 01-Home or [place]    Earlie Server, MD. Schedule an appointment as soon as possible for a visit in 2 weeks.   Specialty:  Orthopedic Surgery Contact information: [address] [place] [address] [phone]         [facility] Follow up.   Specialty:  [place] Why:  [place] will call to arrange appt Contact information: [place] [address] [phone]        [facility] - Dme Follow up.   Why:  rolling walker and 3n1 bedside commode delivered to room  Contact information: [address] [place] [address] [phone]            Signed: Johnny Bridge 02/05/2018, 2:42 PM

## 2018-02-05 NOTE — Progress Notes (Signed)
Physical Therapy Treatment Patient Details Name: Madison Aguilar MRN: [id number] DOB: Dec 25, 1965 Today's Date: 02/05/2018    History of Present Illness 52 yo female s/p R THA posterior approach on 02/03/18. PMH includes OA, plantar fasciitis, GERD, migraine.     PT Comments    Pt progressing well, feeling much better overall today; reviewed stairs, gait, HEP--pt ready fo rd.c home, spouse present for session;  Follow Up Recommendations  Follow surgeon's recommendation for DC plan and follow-up therapies;Supervision for mobility/OOB     Equipment Recommendations  Rolling walker with 5" wheels    Recommendations for Other Services       Precautions / Restrictions Precautions Precautions: Posterior Hip Precaution Booklet Issued: Yes (comment) Precaution Comments: reveiwed precautions initially and during session Restrictions Weight Bearing Restrictions: No Other Position/Activity Restrictions: WBAT     Mobility  Bed Mobility               General bed mobility comments: pt in bathroom on arrival  Transfers Overall transfer level: Needs assistance Equipment used: Rolling walker (2 wheeled) Transfers: Sit to/from Stand Sit to Stand: Supervision         General transfer comment: cues for RLE position/ THP  Ambulation/Gait Ambulation/Gait assistance: Supervision Gait Distance (Feet): 200 Feet Assistive device: Rolling walker (2 wheeled) Gait Pattern/deviations: Step-to pattern;Step-through pattern;Decreased stride length     General Gait Details: cues for sequence, R knee flexion during swing, step through/gait progression   Stairs Stairs: Yes Stairs assistance: Min guard;Min assist Stair Management: Step to pattern;Backwards;With walker Number of Stairs: 4 General stair comments: spouse present and assisted to stabilize walker   Wheelchair Mobility    Modified Rankin (Stroke Patients Only)       Balance Overall balance assessment: Mild deficits  observed, not formally tested                                          Cognition Arousal/Alertness: Awake/alert Behavior During Therapy: WFL for tasks assessed/performed Overall Cognitive Status: Within Functional Limits for tasks assessed                                        Exercises Total Joint Exercises Ankle Circles/Pumps: AROM;10 reps;Both Quad Sets: AROM;10 reps;Both Heel Slides: AAROM;Right;10 reps;AROM Hip ABduction/ADduction: AAROM;10 reps;Right    General Comments        Pertinent Vitals/Pain Pain Assessment: 0-10 Pain Score: 2  Faces Pain Scale: Hurts little more Pain Location: R hip and knee Pain Descriptors / Indicators: Sore;Aching Pain Intervention(s): Monitored during session;Ice applied;Repositioned    Home Living                      Prior Function            PT Goals (current goals can now be found in the care plan section) Acute Rehab PT Goals Patient Stated Goal: to go home PT Goal Formulation: With patient Time For Goal Achievement: 02/10/18 Potential to Achieve Goals: Good Progress towards PT goals: Progressing toward goals    Frequency    7X/week      PT Plan Current plan remains appropriate    Co-evaluation              AM-PAC PT "6 Clicks" Mobility   Outcome Measure  Help needed turning from your back to your side while in a flat bed without using bedrails?: A Little Help needed moving from lying on your back to sitting on the side of a flat bed without using bedrails?: A Little Help needed moving to and from a bed to a chair (including a wheelchair)?: A Little Help needed standing up from a chair using your arms (e.g., wheelchair or bedside chair)?: A Little Help needed to walk in hospital room?: A Little Help needed climbing 3-5 steps with a railing? : A Little 6 Click Score: 18    End of Session Equipment Utilized During Treatment: Gait belt Activity Tolerance: Patient  tolerated treatment well Patient left: in chair;with call bell/phone within reach;with family/visitor present Nurse Communication: Other (comment)(ready for d/c) PT Visit Diagnosis: Other abnormalities of gait and mobility (R26.89)     Time: 8127-5170 PT Time Calculation (min) (ACUTE ONLY): 34 min  Charges:  $Gait Training: 8-22 mins $Therapeutic Exercise: 8-22 mins                     Kenyon Ana, PT  Pager: [phone] Acute Rehab Dept [facility]): [phone]   02/05/2018    [facility] 02/05/2018, 12:15 PM

## 2018-02-05 NOTE — Progress Notes (Signed)
Discharged from floor via w/c for transport home by car. Belongings & spouse with pt. No changes in assessment. Edu On  

## 2018-02-06 ENCOUNTER — Encounter (HOSPITAL_COMMUNITY): Payer: Self-pay | Admitting: Orthopedic Surgery

## 2018-02-07 DIAGNOSIS — M722 Plantar fascial fibromatosis: Secondary | ICD-10-CM | POA: Diagnosis not present

## 2018-02-07 DIAGNOSIS — G43909 Migraine, unspecified, not intractable, without status migrainosus: Secondary | ICD-10-CM | POA: Diagnosis not present

## 2018-02-07 DIAGNOSIS — Z471 Aftercare following joint replacement surgery: Secondary | ICD-10-CM | POA: Diagnosis not present

## 2018-02-13 DIAGNOSIS — M722 Plantar fascial fibromatosis: Secondary | ICD-10-CM | POA: Diagnosis not present

## 2018-02-13 DIAGNOSIS — Z471 Aftercare following joint replacement surgery: Secondary | ICD-10-CM | POA: Diagnosis not present

## 2018-02-13 DIAGNOSIS — G43909 Migraine, unspecified, not intractable, without status migrainosus: Secondary | ICD-10-CM | POA: Diagnosis not present

## 2018-02-16 DIAGNOSIS — M1611 Unilateral primary osteoarthritis, right hip: Secondary | ICD-10-CM | POA: Diagnosis not present

## 2018-02-17 DIAGNOSIS — G43909 Migraine, unspecified, not intractable, without status migrainosus: Secondary | ICD-10-CM | POA: Diagnosis not present

## 2018-02-17 DIAGNOSIS — M722 Plantar fascial fibromatosis: Secondary | ICD-10-CM | POA: Diagnosis not present

## 2018-02-17 DIAGNOSIS — Z471 Aftercare following joint replacement surgery: Secondary | ICD-10-CM | POA: Diagnosis not present

## 2018-02-24 DIAGNOSIS — G43909 Migraine, unspecified, not intractable, without status migrainosus: Secondary | ICD-10-CM | POA: Diagnosis not present

## 2018-02-24 DIAGNOSIS — M722 Plantar fascial fibromatosis: Secondary | ICD-10-CM | POA: Diagnosis not present

## 2018-02-24 DIAGNOSIS — Z471 Aftercare following joint replacement surgery: Secondary | ICD-10-CM | POA: Diagnosis not present

## 2018-03-15 DIAGNOSIS — L309 Dermatitis, unspecified: Secondary | ICD-10-CM | POA: Diagnosis not present

## 2018-03-17 ENCOUNTER — Ambulatory Visit (HOSPITAL_COMMUNITY): Payer: 59 | Attending: Orthopedic Surgery | Admitting: Physical Therapy

## 2018-03-17 ENCOUNTER — Encounter (HOSPITAL_COMMUNITY): Payer: Self-pay | Admitting: Physical Therapy

## 2018-03-17 ENCOUNTER — Other Ambulatory Visit: Payer: Self-pay

## 2018-03-17 DIAGNOSIS — R29898 Other symptoms and signs involving the musculoskeletal system: Secondary | ICD-10-CM | POA: Insufficient documentation

## 2018-03-17 DIAGNOSIS — M6281 Muscle weakness (generalized): Secondary | ICD-10-CM | POA: Diagnosis not present

## 2018-03-17 DIAGNOSIS — R2689 Other abnormalities of gait and mobility: Secondary | ICD-10-CM | POA: Diagnosis not present

## 2018-03-17 NOTE — Therapy (Signed)
[place], [place], [address] Phone: [phone]   Fax:  [phone]  Physical Therapy Evaluation  Patient Details  Name: Madison Aguilar MRN: [id number] Date of Birth: 1966-01-29 Referring Provider (PT): Earlie Server MD   Encounter Date: 03/17/2018  PT End of Session - 03/17/18 1010    Visit Number  1    Number of Visits  13    Date for PT Re-Evaluation  04/28/18   04/07/18 mini re-assess   Authorization Type  [place] (60 vists with PT/OT/ST combined)    Authorization Time Period  03/17/18 - 04/28/18    Authorization - Visit Number  1    Authorization - Number of Visits  63    PT Start Time  6440    PT Stop Time  0855    PT Time Calculation (min)  38 min    Activity Tolerance  Patient tolerated treatment well    Behavior During Therapy  [facility] for tasks assessed/performed       Past Medical History:  Diagnosis Date  . Arthritis    Right hip  . Diverticulosis 09/2010   Left side, noted on colonoscopy  . GERD (gastroesophageal reflux disease)   . History of anemia   . History of colon polyps 09/2010  . Hypothyroid   . Low blood pressure   . Migraine headache   . Plantar fasciitis   . Tinnitus, bilateral   . Varicose vein of leg     Past Surgical History:  Procedure Laterality Date  . Bilateral great toe surgery    . COLONOSCOPY W/ POLYPECTOMY  2012  . DILITATION & CURRETTAGE/HYSTROSCOPY WITH NOVASURE ABLATION  03/03/2012   Procedure: DILATATION & CURETTAGE/HYSTEROSCOPY WITH NOVASURE ABLATION;  Surgeon: Sharene Butters, MD;  Location: [facility];  Service: Gynecology;  Laterality: N/A;  . PLANTAR FASCIA RELEASE    . TOTAL HIP ARTHROPLASTY Right 02/03/2018   Procedure: TOTAL HIP ARTHROPLASTY;  Surgeon: Earlie Server, MD;  Location: [facility];  Service: Orthopedics;  Laterality: Right;    There were no vitals filed for this visit.   Subjective Assessment - 03/17/18 0825    Subjective  Patient  reported that she had a total hip replacement on 02/03/18 on the right via the posterior approach. Patient reported that the only complication she had after surgery was that her blood pressure dropped some after surgery, but everything is back to normal now. Patient stated that the maximum her pain has been over the last week is a 3/10. She stated that she is still having some difficulty with putting on her socks on that side and that she has been trying to follow the precautions given following her surgery.     Pertinent History  Rt THA 02/03/18 Posterior Approach    Limitations  Standing;Walking    How long can you sit comfortably?  1 hour    How long can you stand comfortably?  30 minutes    How long can you walk comfortably?  1 hour    Patient Stated Goals  To get back to work    Currently in Pain?  Yes    Pain Score  1     Pain Location  Hip    Pain Orientation  Right    Pain Descriptors / Indicators  Aching    Pain Type  Surgical pain    Pain Onset  1 to 4 weeks ago    Pain Frequency  Intermittent  Aggravating Factors   Activity    Pain Relieving Factors  Resting and elevating her right leg    Effect of Pain on Daily Activities  Moderately limits    Multiple Pain Sites  No         [facility] PT Assessment - 03/17/18 0001      Assessment   Medical Diagnosis  Right THA     Referring Provider (PT)  Earlie Server MD    Onset Date/Surgical Date  02/03/18    Next MD Visit  Late February 2020    Prior Therapy  Home health following surgery for 4 visits      Precautions   Precautions  Posterior Hip      Restrictions   Weight Bearing Restrictions  No      Balance Screen   Has the patient fallen in the past 6 months  No    Has the patient had a decrease in activity level because of a fear of falling?   Yes      [place]  Private residence    Living Arrangements  Spouse/significant other    Type of [place] to enter     Entrance Stairs-Number of Steps  3    Entrance Stairs-Rails  Left   going up   [place]  One level;Able to live on main level with bedroom/bathroom    [place] - single point;Walker - 2 wheels      Prior Function   Level of Independence  Independent;Independent with basic ADLs      Cognition   Overall Cognitive Status  Within Functional Limits for tasks assessed      Observation/Other Assessments   Focus on Therapeutic Outcomes (FOTO)   56% limited      ROM / Strength   AROM / PROM / Strength  AROM;Strength      AROM   Overall AROM Comments  Right hip AROM noted limited with functional movements and limited by posterior hip precautions      Strength   Overall Strength Comments  Pillow between legs when sidelying    Strength Assessment Site  Hip;Knee;Ankle    Right/Left Hip  Right;Left    Right Hip Flexion  4/5    Right Hip ABduction  4-/5    Left Hip Flexion  5/5    Left Hip ABduction  4+/5    Right/Left Knee  Right;Left    Right Knee Flexion  4/5    Right Knee Extension  4/5    Left Knee Flexion  5/5    Left Knee Extension  5/5    Right/Left Ankle  Right;Left    Right Ankle Dorsiflexion  4+/5    Left Ankle Dorsiflexion  5/5      Ambulation/Gait   Ambulation/Gait  Yes    Ambulation Distance (Feet)  286 Feet   2MWT   Gait Pattern  Step-through pattern;Decreased stride length;Decreased stance time - right;Decreased step length - left;Decreased weight shift to right    Ambulation Surface  Level;Indoor    Gait velocity  0.73 m/s    Stairs  Yes    Stair Management Technique  One rail Right;Step to pattern;With cane    Number of Stairs  4    Height of Stairs  7   inches   Gait Comments  Patient ascended leading with the left and descended leading with the right  Balance   Balance Assessed  Yes      Static Standing Balance   Static Standing - Balance Support  No upper extremity supported    Static Standing Balance -  Activities   Single Leg Stance  - Right Leg;Single Leg Stance - Left Leg    Static Standing - Comment/# of Minutes  37 seconds on the left; 8 seconds on the right                Objective measurements completed on examination: See above findings.              PT Education - 03/17/18 1009    Education Details  Discussed examination findings, plan of care, and continuing HEP given by home health therapist, and reviewed posterior total hip replacement precautions.     Person(s) Educated  Patient    Methods  Explanation    Comprehension  Verbalized understanding       PT Short Term Goals - 03/17/18 1015      PT SHORT TERM GOAL #1   Title  Patient will report understanding and regular compliance with HEP in order to improve strength and functional mobility.     Time  3    Period  Weeks    Status  New    Target Date  04/07/18      PT SHORT TERM GOAL #2   Title  Patient will demonstrate improvement of at least 1/2 MMT strength grade in all deficient muscle groups on the right lower extremity in order to improve gait mechanics and stair mechanics.     Time  3    Period  Weeks    Status  New    Target Date  04/07/18        PT Long Term Goals - 03/17/18 1019      PT LONG TERM GOAL #1   Title  Patient will demonstrate improvement of at least 1 MMT strength grade in all deficient muscle groups on the right lower extremity in order to improve gait mechanics and stair mechanics.     Time  6    Period  Weeks    Status  New    Target Date  04/28/18      PT LONG TERM GOAL #2   Title  Patient will demonstrate ability to ambulate with LRAD at a gait velocity of at least 1.0 m/s on 2MWT indicating improved ability to ambulate in the community and return to work.     Time  6    Period  Weeks    Status  New    Target Date  04/28/18      PT LONG TERM GOAL #3   Title  Patient will demonstrate ability to maintain single limb stance on the right lower extremity for at least 15 seconds indicating  improved stability, balance, and safety with negotiating stairs.     Time  6    Period  Weeks    Status  New    Target Date  04/28/18      PT LONG TERM GOAL #4   Title  Patient will demonstrate improvement of at least 10% on the FOTO survey indicating improvement in percieved functional mobility.     Time  6    Period  Weeks    Status  New    Target Date  04/28/18      PT LONG TERM GOAL #5   Title  Patient will report being able  to independently don and doff socks with no more than minimal difficulty.     Time  6    Period  Weeks    Status  New    Target Date  04/28/18             Plan - 03/17/18 1039    Clinical Impression Statement  Patient is a 53 year old female who presented to outpatient physical therapy S/P right THA on 02/03/18 via posterior approach. Patient presents with post-operative deficits including decreased strength in the right lower extremity, decreased AROM in the right hip with functional mobility, and noted gait deviations on the 2MWT as well as decreased gait velocity and reliance on AD. Patient was also noted to have decreased balance with single limb stance testing. Patient would benefit from skilled physical therapy in order to address the abovementioned deficits and help patient return to her prior level of function.     History and Personal Factors relevant to plan of care:  S/P Right THA on 02/03/18    Clinical Presentation  Stable    Clinical Presentation due to:  FOTO, 2MWT, MMT, SLS    Clinical Decision Making  Low    Rehab Potential  Good    Clinical Impairments Affecting Rehab Potential  See objective measures for more detail    PT Frequency  2x / week   Patient requested 2x/week rather than 3x/week due to returning to work   PT Duration  6 weeks    PT Treatment/Interventions  ADLs/Self Care Home Management;Aquatic Therapy;Electrical Stimulation;Moist Heat;DME Instruction;Gait training;Stair training;Functional mobility training;Therapeutic  activities;Therapeutic exercise;Balance training;Neuromuscular re-education;Patient/family education;Manual techniques;Scar mobilization;Passive range of motion;Dry needling;Taping    PT Next Visit Plan  Review evaluation and goals, review HEP and modify as needed; Follow hip precautions for POSTERIOR THA; work on improving patient's strength and ROM and gait training without the cane; progress to balance activities    PT Home Exercise Plan  Continue with HEP given by HHPT    Consulted and Agree with Plan of Care  Patient       Patient will benefit from skilled therapeutic intervention in order to improve the following deficits and impairments:  Abnormal gait, Decreased balance, Decreased endurance, Decreased mobility, Difficulty walking, Hypomobility, Decreased range of motion, Improper body mechanics, Decreased activity tolerance, Decreased strength, Pain  Visit Diagnosis: Muscle weakness (generalized)  Other abnormalities of gait and mobility  Other symptoms and signs involving the musculoskeletal system     Problem List Patient Active Problem List   Diagnosis Date Noted  . Primary localized osteoarthritis of right hip 01/24/2018  . Plantar fasciitis 10/02/2012  . Unspecified hypothyroidism 10/02/2012  . Left lower quadrant pain 09/22/2010  . Change in bowel function 09/22/2010  . Rectal bleeding 09/22/2010  . GERD (gastroesophageal reflux disease) 09/22/2010  . Esophageal dysphagia 09/22/2010   Clarene Critchley PT, DPT 10:47 AM, 03/17/18 Page [address] [place], [place], [address] Phone: [phone]   Fax:  [phone]  Name: Madison Aguilar MRN: [id number] Date of Birth: Jan 05, 1966

## 2018-03-20 ENCOUNTER — Ambulatory Visit (HOSPITAL_COMMUNITY): Payer: 59

## 2018-03-20 DIAGNOSIS — R2689 Other abnormalities of gait and mobility: Secondary | ICD-10-CM

## 2018-03-20 DIAGNOSIS — M6281 Muscle weakness (generalized): Secondary | ICD-10-CM

## 2018-03-20 DIAGNOSIS — R29898 Other symptoms and signs involving the musculoskeletal system: Secondary | ICD-10-CM

## 2018-03-20 NOTE — Patient Instructions (Addendum)
Flexors, Supine Bridge    Lie supine, feet shoulder-width apart. Lift hips toward ceiling. Hold 3-5___ seconds. Repeat 1-15___ times per session. Do _1__ sessions per day.  Copyright  [employer]HI. All rights reserved.   Straight Leg Raise    Bend one leg. Raise other leg 9-12____ inches with knee locked. Exhale and tighten thigh muscles while raising leg. Repeat with other leg. Repeat 10-15____ times. Do _1___ sessions per day.  [email or web address].[email or web address]   Copyright  VHI. All rights reserved.

## 2018-03-20 NOTE — Therapy (Signed)
[place], [place], [address] Phone: [phone]   Fax:  [phone]  Physical Therapy Treatment  Patient Details  Name: CAMREN HENTHORN MRN: [id number] Date of Birth: [birth year] Referring Provider (PT): Earlie Server MD   Encounter Date: 03/20/2018  PT End of Session - 03/20/18 1034    Visit Number  2    Number of Visits  13    Date for PT Re-Evaluation  04/28/18   04/07/18 mini re-assess   Authorization Type  [place] (60 vists with PT/OT/ST combined)    Authorization Time Period  03/17/18 - 04/28/18    Authorization - Visit Number  2    Authorization - Number of Visits  60    PT Start Time  1034    PT Stop Time  1115    PT Time Calculation (min)  41 min    Activity Tolerance  Patient tolerated treatment well    Behavior During Therapy  [facility] for tasks assessed/performed       Past Medical History:  Diagnosis Date  . Arthritis    Right hip  . Diverticulosis 09/2010   Left side, noted on colonoscopy  . GERD (gastroesophageal reflux disease)   . History of anemia   . History of colon polyps 09/2010  . Hypothyroid   . Low blood pressure   . Migraine headache   . Plantar fasciitis   . Tinnitus, bilateral   . Varicose vein of leg     Past Surgical History:  Procedure Laterality Date  . Bilateral great toe surgery    . COLONOSCOPY W/ POLYPECTOMY  2012  . DILITATION & CURRETTAGE/HYSTROSCOPY WITH NOVASURE ABLATION  03/03/2012   Procedure: DILATATION & CURETTAGE/HYSTEROSCOPY WITH NOVASURE ABLATION;  Surgeon: Sharene Butters, MD;  Location: [facility];  Service: Gynecology;  Laterality: N/A;  . PLANTAR FASCIA RELEASE    . TOTAL HIP ARTHROPLASTY Right 02/03/2018   Procedure: TOTAL HIP ARTHROPLASTY;  Surgeon: Earlie Server, MD;  Location: [facility];  Service: Orthopedics;  Laterality: Right;    There were no vitals filed for this visit.  Subjective Assessment - 03/20/18 1218    Subjective  Patient  reports she feels she is doing pretty well. Wishes she could put on her own right sock but doesn't want to go against precautions. Wants to return to work next week without using the cane.     Pertinent History  Rt THA 02/03/18 Posterior Approach    Limitations  Standing;Walking    How long can you sit comfortably?  1 hour    How long can you stand comfortably?  30 minutes    How long can you walk comfortably?  1 hour    Patient Stated Goals  To get back to work    Currently in Pain?  Yes    Pain Score  2     Pain Location  Hip    Pain Orientation  Right    Pain Descriptors / Indicators  Aching    Pain Type  Surgical pain    Pain Onset  1 to 4 weeks ago                       [facility] Adult PT Treatment/Exercise - 03/20/18 0001      Ambulation/Gait   Ambulation/Gait  Yes    Ambulation Distance (Feet)  200 Feet    Gait Pattern  Step-through pattern;Decreased stride length;Decreased stance time - right;Decreased step  length - left;Decreased weight shift to right    Ambulation Surface  Level;Indoor      Exercises   Exercises  Knee/Hip      Knee/Hip Exercises: Stretches   Active Hamstring Stretch  --      Knee/Hip Exercises: Standing   Knee Flexion  Strengthening;Right;1 set;10 reps    Hip Extension  Stengthening;Right;1 set;10 reps      Knee/Hip Exercises: Supine   Quad Sets  Strengthening;Right;10 reps    Heel Slides  AROM;Right;1 set;10 reps    Bridges  Strengthening;Both;1 set;10 reps    Straight Leg Raises  Right;Strengthening;1 set;5 reps   patient reports anterior hip discomfort with this.   Straight Leg Raises Limitations  QS first    Knee Extension  Strengthening;Right;1 set;10 reps             PT Education - 03/20/18 1223    Education Details  Reviewed goals, eval and HHPT HEP. Advanced HEP. Discussed purpose and technique of interventions throughout session.    Person(s) Educated  Patient    Methods  Explanation;Demonstration;Handout     Comprehension  Verbalized understanding;Returned demonstration       PT Short Term Goals - 03/17/18 1015      PT SHORT TERM GOAL #1   Title  Patient will report understanding and regular compliance with HEP in order to improve strength and functional mobility.     Time  3    Period  Weeks    Status  New    Target Date  04/07/18      PT SHORT TERM GOAL #2   Title  Patient will demonstrate improvement of at least 1/2 MMT strength grade in all deficient muscle groups on the right lower extremity in order to improve gait mechanics and stair mechanics.     Time  3    Period  Weeks    Status  New    Target Date  04/07/18        PT Long Term Goals - 03/17/18 1019      PT LONG TERM GOAL #1   Title  Patient will demonstrate improvement of at least 1 MMT strength grade in all deficient muscle groups on the right lower extremity in order to improve gait mechanics and stair mechanics.     Time  6    Period  Weeks    Status  New    Target Date  04/28/18      PT LONG TERM GOAL #2   Title  Patient will demonstrate ability to ambulate with LRAD at a gait velocity of at least 1.0 m/s on 2MWT indicating improved ability to ambulate in the community and return to work.     Time  6    Period  Weeks    Status  New    Target Date  04/28/18      PT LONG TERM GOAL #3   Title  Patient will demonstrate ability to maintain single limb stance on the right lower extremity for at least 15 seconds indicating improved stability, balance, and safety with negotiating stairs.     Time  6    Period  Weeks    Status  New    Target Date  04/28/18      PT LONG TERM GOAL #4   Title  Patient will demonstrate improvement of at least 10% on the FOTO survey indicating improvement in percieved functional mobility.     Time  6  Period  Weeks    Status  New    Target Date  04/28/18      PT LONG TERM GOAL #5   Title  Patient will report being able to independently don and doff socks with no more than minimal  difficulty.     Time  6    Period  Weeks    Status  New    Target Date  04/28/18            Plan - 03/20/18 1227    Clinical Impression Statement  Session began reviewing initial eval and goals as well as HHPT HEP. Performed gait training without assistive devices with cues to use short equal length steps and diminish antalgic gait over Rt LE. Added supine bridge and SLR to HEP. Patient complained of discomfort in anterior Rt hip with SLR. Cautioned patient to perform this exercises at a low level at home and monitor pain levels. Continue with current plan, progress as able.     Rehab Potential  Good    Clinical Impairments Affecting Rehab Potential  See objective measures for more detail    PT Frequency  2x / week   Patient requested 2x/week rather than 3x/week due to returning to work   PT Duration  6 weeks    PT Treatment/Interventions  ADLs/Self Care Home Management;Aquatic Therapy;Electrical Stimulation;Moist Heat;DME Instruction;Gait training;Stair training;Functional mobility training;Therapeutic activities;Therapeutic exercise;Balance training;Neuromuscular re-education;Patient/family education;Manual techniques;Scar mobilization;Passive range of motion;Dry needling;Taping    PT Next Visit Plan  Follow hip precautions for POSTERIOR THA; work on improving patient's strength and ROM and gait training without the cane; progress to balance activities    PT Home Exercise Plan  Continue with HEP given by HHPT; 03/20/2018 - supine bridge, SLR    Consulted and Agree with Plan of Care  Patient       Patient will benefit from skilled therapeutic intervention in order to improve the following deficits and impairments:  Abnormal gait, Decreased balance, Decreased endurance, Decreased mobility, Difficulty walking, Hypomobility, Decreased range of motion, Improper body mechanics, Decreased activity tolerance, Decreased strength, Pain  Visit Diagnosis: Muscle weakness (generalized)  Other  abnormalities of gait and mobility  Other symptoms and signs involving the musculoskeletal system     Problem List Patient Active Problem List   Diagnosis Date Noted  . Primary localized osteoarthritis of right hip 01/24/2018  . Plantar fasciitis 10/02/2012  . Unspecified hypothyroidism 10/02/2012  . Left lower quadrant pain 09/22/2010  . Change in bowel function 09/22/2010  . Rectal bleeding 09/22/2010  . GERD (gastroesophageal reflux disease) 09/22/2010  . Esophageal dysphagia 09/22/2010    Floria Raveling. Hartnett-Rands, MS, PT Per [facility] #[id number] 03/20/2018, 12:29 PM  [facility] [address] [place], [place], [address] Phone: [phone]   Fax:  [phone]  Name: HALLA CHOPP MRN: [id number] Date of Birth: 04-10-1965

## 2018-03-22 ENCOUNTER — Ambulatory Visit (HOSPITAL_COMMUNITY): Payer: 59

## 2018-03-22 ENCOUNTER — Encounter (HOSPITAL_COMMUNITY): Payer: Self-pay

## 2018-03-22 DIAGNOSIS — R29898 Other symptoms and signs involving the musculoskeletal system: Secondary | ICD-10-CM

## 2018-03-22 DIAGNOSIS — M6281 Muscle weakness (generalized): Secondary | ICD-10-CM | POA: Diagnosis not present

## 2018-03-22 DIAGNOSIS — R2689 Other abnormalities of gait and mobility: Secondary | ICD-10-CM

## 2018-03-22 NOTE — Therapy (Signed)
[place], [place], [address] Phone: [phone]   Fax:  [phone]  Physical Therapy Treatment  Patient Details  Name: Madison Aguilar MRN: [id number] Date of Birth: 12-01-1965 Referring Provider (PT): Earlie Server MD   Encounter Date: 03/22/2018  PT End of Session - 03/22/18 1134    Visit Number  3    Number of Visits  13    Date for PT Re-Evaluation  04/28/18   Minireassess 04/07/18   Authorization Type  [employer] (60 vists with PT/OT/ST combined)    Authorization Time Period  03/17/18 - 04/28/18    Authorization - Visit Number  3    Authorization - Number of Visits  60    PT Start Time  1122    PT Stop Time  1201    PT Time Calculation (min)  39 min    Activity Tolerance  Patient tolerated treatment well    Behavior During Therapy  [facility] for tasks assessed/performed       Past Medical History:  Diagnosis Date  . Arthritis    Right hip  . Diverticulosis 09/2010   Left side, noted on colonoscopy  . GERD (gastroesophageal reflux disease)   . History of anemia   . History of colon polyps 09/2010  . Hypothyroid   . Low blood pressure   . Migraine headache   . Plantar fasciitis   . Tinnitus, bilateral   . Varicose vein of leg     Past Surgical History:  Procedure Laterality Date  . Bilateral great toe surgery    . COLONOSCOPY W/ POLYPECTOMY  2012  . DILITATION & CURRETTAGE/HYSTROSCOPY WITH NOVASURE ABLATION  03/03/2012   Procedure: DILATATION & CURETTAGE/HYSTEROSCOPY WITH NOVASURE ABLATION;  Surgeon: Sharene Butters, MD;  Location: [facility];  Service: Gynecology;  Laterality: N/A;  . PLANTAR FASCIA RELEASE    . TOTAL HIP ARTHROPLASTY Right 02/03/2018   Procedure: TOTAL HIP ARTHROPLASTY;  Surgeon: Earlie Server, MD;  Location: [facility];  Service: Orthopedics;  Laterality: Right;    There were no vitals filed for this visit.  Subjective Assessment - 03/22/18 1130    Subjective  Pt stated she  is feeling today.  Plans to return to work next week.  Wishes to RTW without using the cane, has tried some around the house though does limp.    Patient Stated Goals  To get back to work    Currently in Pain?  No/denies         [facility] PT Assessment - 03/22/18 0001      Assessment   Medical Diagnosis  Right THA     Referring Provider (PT)  Earlie Server MD    Onset Date/Surgical Date  02/03/18    Hand Dominance  Right    Next MD Visit  04/20/18    Prior Therapy  Home health following surgery for 4 visits      Precautions   Precautions  Posterior Hip                   [facility] Adult PT Treatment/Exercise - 03/22/18 0001      Ambulation/Gait   Ambulation/Gait  Yes    Ambulation Distance (Feet)  200 Feet    Assistive device  Straight cane    Gait Pattern  Step-through pattern;Decreased stride length;Decreased stance time - right;Decreased step length - left;Decreased weight shift to right    Ambulation Surface  Level;Indoor      Knee/Hip  Exercises: Standing   Heel Raises  10 reps    Heel Raises Limitations  heel and toe raises on slope    Hip Abduction  Both;10 reps;Knee straight    Hip Extension  Stengthening;Right;1 set;10 reps    Rocker Board  2 minutes    Rocker Board Limitations  lateral      Knee/Hip Exercises: Seated   Sit to Sand  10 reps;without UE support   eccentric control     Knee/Hip Exercises: Supine   Heel Slides  AROM;Right;1 set;10 reps    Bridges  Strengthening;Both;1 set;10 reps    Bridges Limitations  2 sets    Straight Leg Raises  Right;Strengthening;1 set;10 reps      Knee/Hip Exercises: Sidelying   Clams  10x 3"      Knee/Hip Exercises: Prone   Hip Extension  Both;10 reps    Hip Extension Limitations  limited range, Rt knee bent               PT Short Term Goals - 03/17/18 1015      PT SHORT TERM GOAL #1   Title  Patient will report understanding and regular compliance with HEP in order to improve strength and  functional mobility.     Time  3    Period  Weeks    Status  New    Target Date  04/07/18      PT SHORT TERM GOAL #2   Title  Patient will demonstrate improvement of at least 1/2 MMT strength grade in all deficient muscle groups on the right lower extremity in order to improve gait mechanics and stair mechanics.     Time  3    Period  Weeks    Status  New    Target Date  04/07/18        PT Long Term Goals - 03/17/18 1019      PT LONG TERM GOAL #1   Title  Patient will demonstrate improvement of at least 1 MMT strength grade in all deficient muscle groups on the right lower extremity in order to improve gait mechanics and stair mechanics.     Time  6    Period  Weeks    Status  New    Target Date  04/28/18      PT LONG TERM GOAL #2   Title  Patient will demonstrate ability to ambulate with LRAD at a gait velocity of at least 1.0 m/s on 2MWT indicating improved ability to ambulate in the community and return to work.     Time  6    Period  Weeks    Status  New    Target Date  04/28/18      PT LONG TERM GOAL #3   Title  Patient will demonstrate ability to maintain single limb stance on the right lower extremity for at least 15 seconds indicating improved stability, balance, and safety with negotiating stairs.     Time  6    Period  Weeks    Status  New    Target Date  04/28/18      PT LONG TERM GOAL #4   Title  Patient will demonstrate improvement of at least 10% on the FOTO survey indicating improvement in percieved functional mobility.     Time  6    Period  Weeks    Status  New    Target Date  04/28/18      PT LONG TERM GOAL #5  Title  Patient will report being able to independently don and doff socks with no more than minimal difficulty.     Time  6    Period  Weeks    Status  New    Target Date  04/28/18            Plan - 03/22/18 1217    Clinical Impression Statement  Session focus on hip strengthening and gait training without AD.  Added rockerboard,  sidelying AAROM abduction and prone hip extension for strengthening.  Pt ambulated with [place] gait mechanics without SPC due to gluteal weaknesses, encouraged to continue wiht cane with RTW next week.  Reviewed precautions, pt able to verbalize appropriate precautious without any cueing required.      Rehab Potential  Good    Clinical Impairments Affecting Rehab Potential  See objective measures for more detail    PT Frequency  2x / week    PT Duration  6 weeks    PT Treatment/Interventions  ADLs/Self Care Home Management;Aquatic Therapy;Electrical Stimulation;Moist Heat;DME Instruction;Gait training;Stair training;Functional mobility training;Therapeutic activities;Therapeutic exercise;Balance training;Neuromuscular re-education;Patient/family education;Manual techniques;Scar mobilization;Passive range of motion;Dry needling;Taping    PT Next Visit Plan  Next session begin SLS.  Follow hip precautions for POSTERIOR THA; work on improving patient's strength and ROM and gait training without the cane; progress to balance activities    PT Home Exercise Plan  Continue with HEP given by HHPT; 03/20/2018 - supine bridge, SLR       Patient will benefit from skilled therapeutic intervention in order to improve the following deficits and impairments:  Abnormal gait, Decreased balance, Decreased endurance, Decreased mobility, Difficulty walking, Hypomobility, Decreased range of motion, Improper body mechanics, Decreased activity tolerance, Decreased strength, Pain  Visit Diagnosis: Muscle weakness (generalized)  Other abnormalities of gait and mobility  Other symptoms and signs involving the musculoskeletal system     Problem List Patient Active Problem List   Diagnosis Date Noted  . Primary localized osteoarthritis of right hip 01/24/2018  . Plantar fasciitis 10/02/2012  . Unspecified hypothyroidism 10/02/2012  . Left lower quadrant pain 09/22/2010  . Change in bowel function 09/22/2010   . Rectal bleeding 09/22/2010  . GERD (gastroesophageal reflux disease) 09/22/2010  . Esophageal dysphagia 09/22/2010    Ihor Austin, LPTA; [place]  Aldona Lento 03/22/2018, 12:21 PM  [place], [place], [address] Phone: [phone]   Fax:  [phone]  Name: Madison Aguilar MRN: [id number] Date of Birth: [birth year]

## 2018-03-24 ENCOUNTER — Encounter (HOSPITAL_COMMUNITY): Payer: 59

## 2018-03-24 IMAGING — DX DG LUMBAR SPINE COMPLETE 4+V
5 series · 5 of 5 positions shown · non-contrast
Comparison: None in PACs

CLINICAL DATA: Chronic low back pain radiating into the left hip.

EXAM:
LUMBAR SPINE - COMPLETE 4+ VIEW

[l-spine ap]
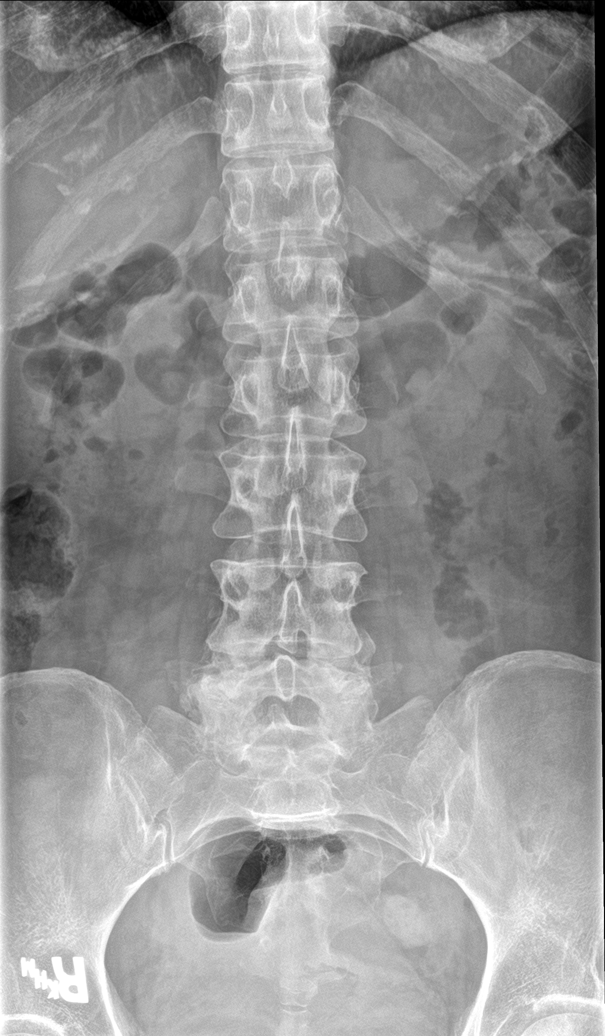

[l-spine obl (1 of 2)]
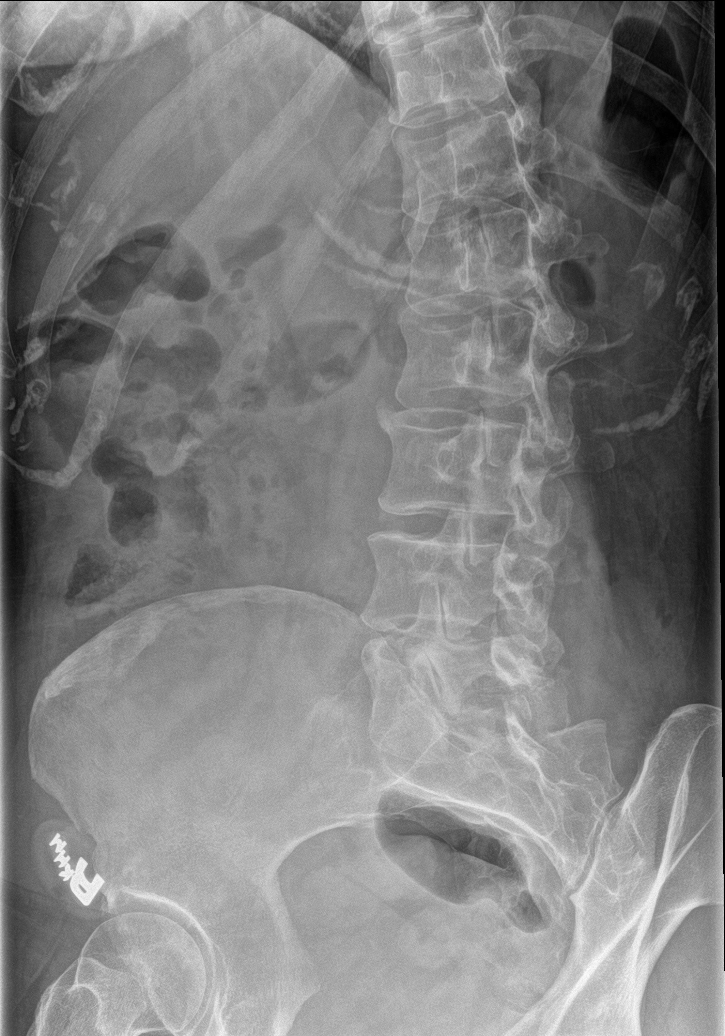

[l-spine lat]
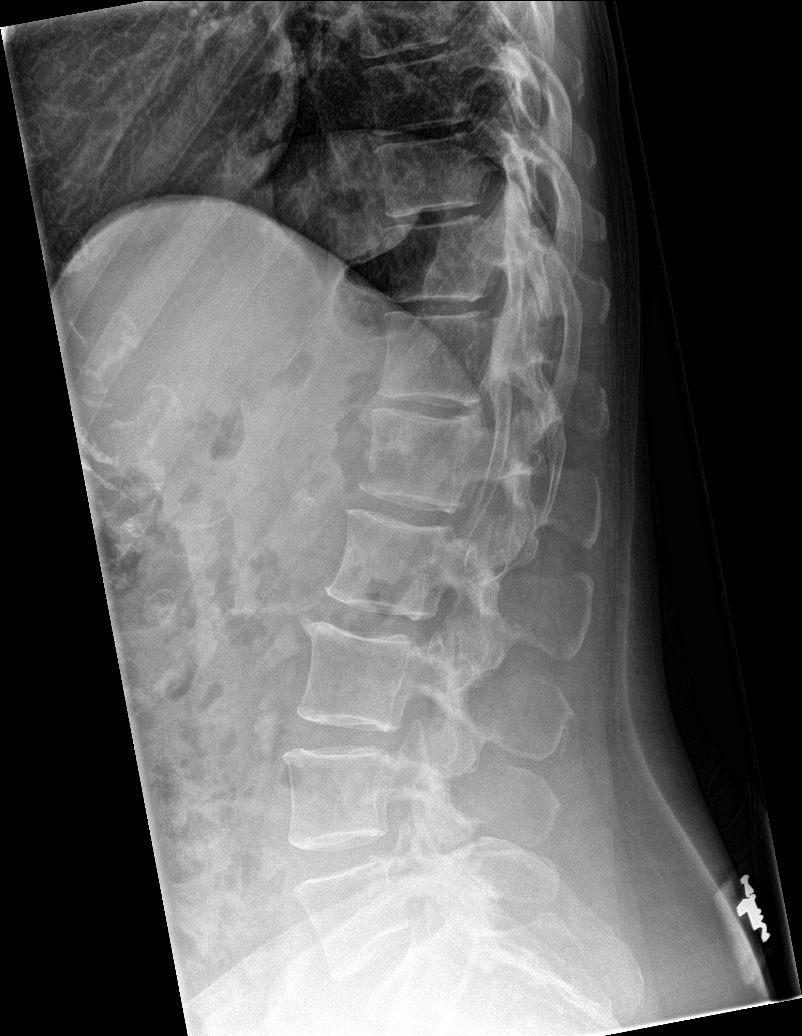

[l-spine spot]
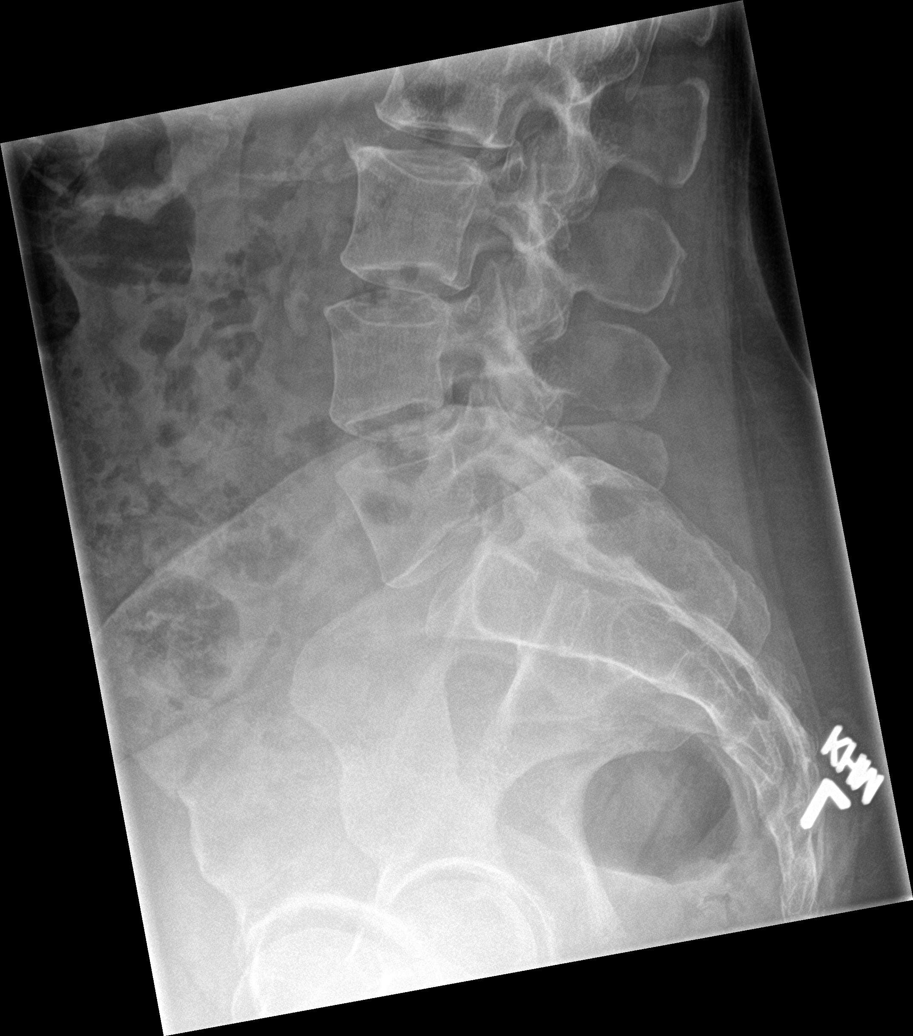

[l-spine obl (2 of 2)]
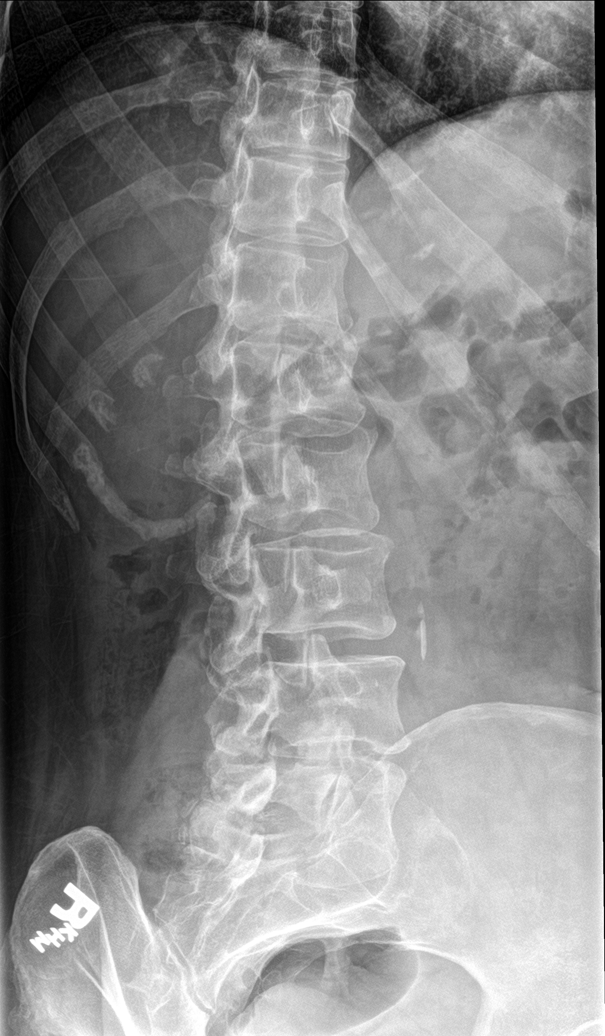

[5 of 5 positions shown; findings below may reference images not displayed]

FINDINGS: The lumbar vertebral bodies are preserved in height. The disc space
heights are well maintained. There is no spondylolisthesis. There is
facet joint hypertrophy at L4-5 and at L5-S1. The pedicles and
transverse processes are intact. The observed portions of the sacrum
are normal.
IMPRESSION: Degenerative facet joint change at L4-5 and L5-S1. No compression
fracture, spondylolisthesis, or significant disc space narrowing.

## 2018-03-28 ENCOUNTER — Telehealth (HOSPITAL_COMMUNITY): Payer: Self-pay | Admitting: Family Medicine

## 2018-03-28 NOTE — Telephone Encounter (Signed)
Lmonvm advising the pt that I received her message regarding changing two appts on her schedule. Asked the pt to call us back to reschedule.

## 2018-03-28 NOTE — Telephone Encounter (Signed)
03/28/18  pt cx because she will be in training all week

## 2018-03-29 ENCOUNTER — Ambulatory Visit (HOSPITAL_COMMUNITY): Payer: 59

## 2018-04-04 ENCOUNTER — Encounter (HOSPITAL_COMMUNITY): Payer: 59 | Admitting: Physical Therapy

## 2018-04-05 ENCOUNTER — Telehealth (HOSPITAL_COMMUNITY): Payer: Self-pay

## 2018-04-05 NOTE — Telephone Encounter (Signed)
Pt agreeded to cx 2/13- PTA out sick

## 2018-04-06 ENCOUNTER — Ambulatory Visit (HOSPITAL_COMMUNITY): Payer: 59

## 2018-04-11 ENCOUNTER — Encounter (HOSPITAL_COMMUNITY): Payer: Self-pay

## 2018-04-11 ENCOUNTER — Ambulatory Visit (HOSPITAL_COMMUNITY): Payer: 59 | Attending: Orthopedic Surgery

## 2018-04-11 DIAGNOSIS — R2689 Other abnormalities of gait and mobility: Secondary | ICD-10-CM

## 2018-04-11 DIAGNOSIS — R29898 Other symptoms and signs involving the musculoskeletal system: Secondary | ICD-10-CM

## 2018-04-11 DIAGNOSIS — M6281 Muscle weakness (generalized): Secondary | ICD-10-CM | POA: Insufficient documentation

## 2018-04-11 NOTE — Therapy (Signed)
[place], [place], [address] Phone: [phone]   Fax:  [phone]  Physical Therapy Treatment  Patient Details  Name: Madison Aguilar MRN: [id number] Date of Birth: 30-Nov-1965 Referring Provider (PT): Earlie Server MD   Encounter Date: 04/11/2018  PT End of Session - 04/11/18 1744    Visit Number  4    Number of Visits  13    Date for PT Re-Evaluation  04/28/18   Minireassess 04/07/18   Authorization Type  [employer] (60 vists with PT/OT/ST combined)    Authorization Time Period  03/17/18 - 04/28/18    Authorization - Visit Number  4    Authorization - Number of Visits  60    PT Start Time  4967    PT Stop Time  1820    PT Time Calculation (min)  44 min    Activity Tolerance  Patient tolerated treatment well    Behavior During Therapy  [facility] for tasks assessed/performed       Past Medical History:  Diagnosis Date  . Arthritis    Right hip  . Diverticulosis 09/2010   Left side, noted on colonoscopy  . GERD (gastroesophageal reflux disease)   . History of anemia   . History of colon polyps 09/2010  . Hypothyroid   . Low blood pressure   . Migraine headache   . Plantar fasciitis   . Tinnitus, bilateral   . Varicose vein of leg     Past Surgical History:  Procedure Laterality Date  . Bilateral great toe surgery    . COLONOSCOPY W/ POLYPECTOMY  2012  . DILITATION & CURRETTAGE/HYSTROSCOPY WITH NOVASURE ABLATION  03/03/2012   Procedure: DILATATION & CURETTAGE/HYSTEROSCOPY WITH NOVASURE ABLATION;  Surgeon: Sharene Butters, MD;  Location: [facility];  Service: Gynecology;  Laterality: N/A;  . PLANTAR FASCIA RELEASE    . TOTAL HIP ARTHROPLASTY Right 02/03/2018   Procedure: TOTAL HIP ARTHROPLASTY;  Surgeon: Earlie Server, MD;  Location: [facility];  Service: Orthopedics;  Laterality: Right;    There were no vitals filed for this visit.  Subjective Assessment - 04/11/18 1740    Subjective  Pt arrived  without AD, reports she has new back pain that occurs once on feet for several hours.  Current pain scale 2/10    Patient Stated Goals  To get back to work    Currently in Pain?  Yes    Pain Score  2     Pain Location  Hip    Pain Orientation  Right    Pain Descriptors / Indicators  Aching    Pain Type  Surgical pain    Pain Onset  1 to 4 weeks ago    Pain Frequency  Intermittent    Aggravating Factors   activity    Pain Relieving Factors  resting and elevating her right leg    Effect of Pain on Daily Activities  moderately limits                       [facility] Adult PT Treatment/Exercise - 04/11/18 0001      Knee/Hip Exercises: Standing   Heel Raises  15 reps    Heel Raises Limitations  heel and toe raises on slope    Hip Abduction  Both;Knee straight    Abduction Limitations  RTB 3" holds    Hip Extension  Stengthening;Right;1 set;10 reps    Extension Limitations  RTB  Functional Squat  10 reps    Rocker Board  2 minutes    Rocker Board Limitations  lateral and DF/PF    SLS  Lt 26", Rt 21"    Gait Training  no AD x 400     Other Standing Knee Exercises  sidestep wth RTB 2RT      Knee/Hip Exercises: Seated   Sit to Sand  10 reps;without UE support      Knee/Hip Exercises: Supine   Bridges  15 reps      Manual Therapy   Manual Therapy  Myofascial release    Manual therapy comments  Manual complete separate than rest of tx    Myofascial Release  Educated on scar tissue massage technqiues to begin at home             PT Education - 04/11/18 1825    Education Details  Reviewed compliance wiht HEP. Discussed posterior THR precautions.  Educated scar tissue massage and desensitization stragies.    Person(s) Educated  Patient    Methods  Explanation;Demonstration    Comprehension  Verbalized understanding       PT Short Term Goals - 03/17/18 1015      PT SHORT TERM GOAL #1   Title  Patient will report understanding and regular compliance with HEP  in order to improve strength and functional mobility.     Time  3    Period  Weeks    Status  New    Target Date  04/07/18      PT SHORT TERM GOAL #2   Title  Patient will demonstrate improvement of at least 1/2 MMT strength grade in all deficient muscle groups on the right lower extremity in order to improve gait mechanics and stair mechanics.     Time  3    Period  Weeks    Status  New    Target Date  04/07/18        PT Long Term Goals - 03/17/18 1019      PT LONG TERM GOAL #1   Title  Patient will demonstrate improvement of at least 1 MMT strength grade in all deficient muscle groups on the right lower extremity in order to improve gait mechanics and stair mechanics.     Time  6    Period  Weeks    Status  New    Target Date  04/28/18      PT LONG TERM GOAL #2   Title  Patient will demonstrate ability to ambulate with LRAD at a gait velocity of at least 1.0 m/s on 2MWT indicating improved ability to ambulate in the community and return to work.     Time  6    Period  Weeks    Status  New    Target Date  04/28/18      PT LONG TERM GOAL #3   Title  Patient will demonstrate ability to maintain single limb stance on the right lower extremity for at least 15 seconds indicating improved stability, balance, and safety with negotiating stairs.     Time  6    Period  Weeks    Status  New    Target Date  04/28/18      PT LONG TERM GOAL #4   Title  Patient will demonstrate improvement of at least 10% on the FOTO survey indicating improvement in percieved functional mobility.     Time  6    Period  Weeks  Status  New    Target Date  04/28/18      PT LONG TERM GOAL #5   Title  Patient will report being able to independently don and doff socks with no more than minimal difficulty.     Time  6    Period  Weeks    Status  New    Target Date  04/28/18            Plan - 04/11/18 1821    Clinical Impression Statement  Pt arrived ambulating without AD and big smile on  face stateing increased ease don/doffing socks and shoes.  Session focus with gait training and funcitonal strengthening.  Pt able to ambulate at slower pace with good gait mechanics, increased cadence with [place] mechanics due to gluteal weakness.  Added SLS, sidestep with resistance and squats with cueing for equal weight bearing.  Pt reports some sensitivity with touch, reviewed desensitization stragies to begin at home as well as scar tissue massage to address adhesions on incision.  EOS pt limited by fatigue with tasks.      Rehab Potential  Good    PT Frequency  2x / week    PT Duration  6 weeks    PT Treatment/Interventions  ADLs/Self Care Home Management;Aquatic Therapy;Electrical Stimulation;Moist Heat;DME Instruction;Gait training;Stair training;Functional mobility training;Therapeutic activities;Therapeutic exercise;Balance training;Neuromuscular re-education;Patient/family education;Manual techniques;Scar mobilization;Passive range of motion;Dry needling;Taping    PT Next Visit Plan  Minireassess next session.  Begin step up training and continue gluteal strengthening and gait training.  ollow hip precautions for POSTERIOR THA; work on improving patient's strength and ROM and gait training without the cane; progress to balance activities    PT Home Exercise Plan  Continue with HEP given by HHPT; 03/20/2018 - supine bridge, SLR; 2/18: SLS and bridges, desensitization stragies and scar tissue massage       Patient will benefit from skilled therapeutic intervention in order to improve the following deficits and impairments:  Abnormal gait, Decreased balance, Decreased endurance, Decreased mobility, Difficulty walking, Hypomobility, Decreased range of motion, Improper body mechanics, Decreased activity tolerance, Decreased strength, Pain  Visit Diagnosis: Muscle weakness (generalized)  Other abnormalities of gait and mobility  Other symptoms and signs involving the musculoskeletal  system     Problem List Patient Active Problem List   Diagnosis Date Noted  . Primary localized osteoarthritis of right hip 01/24/2018  . Plantar fasciitis 10/02/2012  . Unspecified hypothyroidism 10/02/2012  . Left lower quadrant pain 09/22/2010  . Change in bowel function 09/22/2010  . Rectal bleeding 09/22/2010  . GERD (gastroesophageal reflux disease) 09/22/2010  . Esophageal dysphagia 09/22/2010   Ihor Austin, LPTA; Howard  Aldona Lento 04/11/2018, 6:30 PM  [place], [place], [address] Phone: [phone]   Fax:  [phone]  Name: NIKIAH GOIN MRN: [id number] Date of Birth: 1965/04/13

## 2018-04-11 NOTE — Patient Instructions (Signed)
Bridge    Lie back, legs bent. Inhale, pressing hips up. Keeping ribs in, lengthen lower back. Exhale, rolling down along spine from top. Repeat 10-20 times. Do 1-2 sessions per day.  http://pm.exer.us/55   Copyright  VHI. All rights reserved.   Single Leg Balance: Eyes Open    Stand on right leg with eyes open. Hold 30 seconds. 5 reps per day.  http://ggbe.exer.us/5   Copyright  VHI. All rights reserved.

## 2018-04-13 ENCOUNTER — Ambulatory Visit (HOSPITAL_COMMUNITY): Payer: 59

## 2018-04-18 ENCOUNTER — Ambulatory Visit (HOSPITAL_COMMUNITY): Payer: 59 | Admitting: Physical Therapy

## 2018-04-19 ENCOUNTER — Ambulatory Visit (HOSPITAL_COMMUNITY): Payer: 59

## 2018-04-19 ENCOUNTER — Telehealth (HOSPITAL_COMMUNITY): Payer: Self-pay

## 2018-04-19 NOTE — Telephone Encounter (Signed)
She has to work late and can not come in today

## 2018-04-20 ENCOUNTER — Ambulatory Visit (HOSPITAL_COMMUNITY): Payer: 59

## 2018-04-25 ENCOUNTER — Telehealth (HOSPITAL_COMMUNITY): Payer: Self-pay

## 2018-04-25 ENCOUNTER — Ambulatory Visit (HOSPITAL_COMMUNITY): Payer: 59 | Attending: Orthopedic Surgery

## 2018-04-25 DIAGNOSIS — R2689 Other abnormalities of gait and mobility: Secondary | ICD-10-CM | POA: Insufficient documentation

## 2018-04-25 DIAGNOSIS — R29898 Other symptoms and signs involving the musculoskeletal system: Secondary | ICD-10-CM | POA: Insufficient documentation

## 2018-04-25 DIAGNOSIS — M6281 Muscle weakness (generalized): Secondary | ICD-10-CM | POA: Insufficient documentation

## 2018-04-25 NOTE — Telephone Encounter (Signed)
No show, called and spoke to pt who stated she was unable to leave work.  Pt stated she had to reschedule MD apt last Thursday to this Thursday so will be unable to make it this week.  Discussed placing therapy on hold- pt stated she will call office following MD apt and let us know if she wishes to continue or hold.  Reminded No show policy and encouraged pt to call if unable to make it with verbalized understanding.   [address] [place]; [facility] [phone]

## 2018-04-27 ENCOUNTER — Ambulatory Visit (HOSPITAL_COMMUNITY): Payer: 59

## 2018-04-27 DIAGNOSIS — M1611 Unilateral primary osteoarthritis, right hip: Secondary | ICD-10-CM | POA: Diagnosis not present

## 2018-05-02 ENCOUNTER — Ambulatory Visit (HOSPITAL_COMMUNITY): Payer: 59 | Admitting: Physical Therapy

## 2018-05-02 ENCOUNTER — Encounter (HOSPITAL_COMMUNITY): Payer: Self-pay | Admitting: Physical Therapy

## 2018-05-02 DIAGNOSIS — R2689 Other abnormalities of gait and mobility: Secondary | ICD-10-CM

## 2018-05-02 DIAGNOSIS — R29898 Other symptoms and signs involving the musculoskeletal system: Secondary | ICD-10-CM | POA: Diagnosis not present

## 2018-05-02 DIAGNOSIS — M6281 Muscle weakness (generalized): Secondary | ICD-10-CM

## 2018-05-02 NOTE — Patient Instructions (Addendum)
  ABDUCTION: Standing - Resistance Band (Active)    Stand, feet flat. Against red resistance band, lift right leg out to side. Complete _1__ sets of _10__ repetitions. Perform _1__ sessions per day.  http://gtsc.exer.us/117   Copyright  VHI. All rights reserved.  EXTENSION: Standing - Resistance Band (Active)    Stand, both feet flat. Against red resistance band, draw right leg behind body as far as possible. Complete __1_ sets of _10__ repetitions. Perform _1__ sessions per day.  http://gtsc.exer.us/83   Copyright  VHI. All rights reserved.  One Step Stair    Using stair or stool, step up then down with same leg __10__ times. Repeat using other leg. Repeat _1___ times. Do __1__ sessions per day.  http://gt2.exer.us/893   Copyright  VHI. All rights reserved.

## 2018-05-02 NOTE — Therapy (Signed)
[place], [place], [address] Phone: [phone]   Fax:  [phone]  Physical Therapy Treatment / Progress Note / Discharge Summary  Patient Details  Name: Madison Aguilar MRN: [id number] Date of Birth: 1965/11/19 Referring Provider (PT): Earlie Server MD   Encounter Date: 05/02/2018   Progress Note Reporting Period 03/17/18 to 05/02/18  See note below for Objective Data and Assessment of Progress/Goals.    PHYSICAL THERAPY DISCHARGE SUMMARY  Visits from Start of Care: 5  Current functional level related to goals / functional outcomes: See below   Remaining deficits: See below   Education / Equipment: Educated on HEP and following up with physician. See below.  Plan: Patient agrees to discharge.  Patient goals were partially met. Patient is being discharged due to                                                     ?????         Patient having difficulty attending appointments due to work, and patient request for discharge to HEP.   PT End of Session - 05/02/18 1701    Visit Number  5    Number of Visits  13    Date for PT Re-Evaluation  04/28/18   Minireassess 04/07/18   Authorization Type  [employer] (60 vists with PT/OT/ST combined)    Authorization Time Period  03/17/18 - 04/28/18    Authorization - Visit Number  5    Authorization - Number of Visits  60    PT Start Time  1655    PT Stop Time  1727    PT Time Calculation (min)  32 min    Activity Tolerance  Patient tolerated treatment well    Behavior During Therapy  [facility] for tasks assessed/performed       Past Medical History:  Diagnosis Date  . Arthritis    Right hip  . Diverticulosis 09/2010   Left side, noted on colonoscopy  . GERD (gastroesophageal reflux disease)   . History of anemia   . History of colon polyps 09/2010  . Hypothyroid   . Low blood pressure   . Migraine headache   . Plantar fasciitis   . Tinnitus,  bilateral   . Varicose vein of leg     Past Surgical History:  Procedure Laterality Date  . Bilateral great toe surgery    . COLONOSCOPY W/ POLYPECTOMY  2012  . DILITATION & CURRETTAGE/HYSTROSCOPY WITH NOVASURE ABLATION  03/03/2012   Procedure: DILATATION & CURETTAGE/HYSTEROSCOPY WITH NOVASURE ABLATION;  Surgeon: Sharene Butters, MD;  Location: [facility];  Service: Gynecology;  Laterality: N/A;  . PLANTAR FASCIA RELEASE    . TOTAL HIP ARTHROPLASTY Right 02/03/2018   Procedure: TOTAL HIP ARTHROPLASTY;  Surgeon: Earlie Server, MD;  Location: [facility];  Service: Orthopedics;  Laterality: Right;    There were no vitals filed for this visit.  Subjective Assessment - 05/02/18 1700    Subjective  Patient denied any pain. She reported that she would like to transition to a HEP due to having difficulty making appointments with her schedule.     Patient Stated Goals  To get back to work    Currently in Pain?  No/denies         [facility] PT Assessment -  05/02/18 0001      Assessment   Medical Diagnosis  Right THA     Referring Provider (PT)  Earlie Server MD    Onset Date/Surgical Date  02/03/18      Precautions   Precautions  Posterior Hip      Observation/Other Assessments   Focus on Therapeutic Outcomes (FOTO)   49% limited   was 56% limited     Strength   Right Hip Flexion  5/5   was 4   Right Hip Extension  3+/5    Right Hip ABduction  4/5   was 4-   Left Hip Flexion  5/5    Left Hip Extension  5/5    Left Hip ABduction  5/5   was 4+   Right Knee Flexion  5/5   was 4   Right Knee Extension  5/5   was 4   Left Knee Flexion  5/5    Left Knee Extension  5/5    Right Ankle Dorsiflexion  5/5   was 4+   Left Ankle Dorsiflexion  5/5      Ambulation/Gait   Ambulation/Gait  Yes    Ambulation Distance (Feet)  406 Feet   2MWT   Assistive device  None    Gait Pattern  Step-through pattern;Decreased stride length;Decreased stance time - right;Decreased step length -  left;Decreased weight shift to right    Ambulation Surface  Level;Indoor    Gait velocity  1.03 m/s   was 0.73 m/s     Static Standing Balance   Static Standing - Balance Support  No upper extremity supported    Static Standing Balance -  Activities   Single Leg Stance - Right Leg    Static Standing - Comment/# of Minutes  8 seconds on the right                           PT Education - 05/02/18 1741    Education Details  Reviewed examination findings and an updated HEP.     Person(s) Educated  Patient    Methods  Explanation;Handout    Comprehension  Verbalized understanding       PT Short Term Goals - 05/02/18 1735      PT SHORT TERM GOAL #1   Title  Patient will report understanding and regular compliance with HEP in order to improve strength and functional mobility.     Time  3    Period  Weeks    Status  Achieved    Target Date  04/07/18      PT SHORT TERM GOAL #2   Title  Patient will demonstrate improvement of at least 1/2 MMT strength grade in all deficient muscle groups on the right lower extremity in order to improve gait mechanics and stair mechanics.     Baseline  05/02/18: See MMT    Time  3    Period  Weeks    Status  Achieved    Target Date  04/07/18        PT Long Term Goals - 05/02/18 1735      PT LONG TERM GOAL #1   Title  Patient will demonstrate improvement of at least 1 MMT strength grade in all deficient muscle groups on the right lower extremity in order to improve gait mechanics and stair mechanics.     Baseline  05/02/18: See MMT    Time  6    Period  Weeks    Status  On-going      PT LONG TERM GOAL #2   Title  Patient will demonstrate ability to ambulate with LRAD at a gait velocity of at least 1.0 m/s on 2MWT indicating improved ability to ambulate in the community and return to work.     Baseline  05/02/18: See objective measures    Time  6    Period  Weeks    Status  Achieved      PT LONG TERM GOAL #3   Title   Patient will demonstrate ability to maintain single limb stance on the right lower extremity for at least 15 seconds indicating improved stability, balance, and safety with negotiating stairs.     Baseline  05/02/18: Patient performed SLS for maximum of 8 seconds on the right    Time  6    Period  Weeks    Status  On-going      PT LONG TERM GOAL #4   Title  Patient will demonstrate improvement of at least 10% on the FOTO survey indicating improvement in percieved functional mobility.     Baseline  05/02/18: See objective measures    Time  6    Period  Weeks    Status  On-going      PT LONG TERM GOAL #5   Title  Patient will report being able to independently don and doff socks with no more than minimal difficulty.     Baseline  05/02/18: Paitent reported no more than minimal difficulty with this    Time  6    Period  Weeks    Status  Achieved            Plan - 05/02/18 1740    Clinical Impression Statement  Performed a re-assessment of patient's progress towards goals this session as it was outside of patient's plan of care at this time. Patient had achieved 2 out of 2 short term goals. Patient had achieved 2 out of 5 long term goals. Patient reported that due to her work situation it has been challenging to make appointments and requested discharge to HEP to accommodate her schedule better. The remainder of the session, the therapist educated patient on examination findings, and additional exercises to perform at home as well as to follow-up with her physician. Patient consented to this plan. Patient is being discharged at this time due to the reason explained above.     Rehab Potential  Good    PT Frequency  2x / week    PT Duration  6 weeks    PT Treatment/Interventions  ADLs/Self Care Home Management;Aquatic Therapy;Electrical Stimulation;Moist Heat;DME Instruction;Gait training;Stair training;Functional mobility training;Therapeutic activities;Therapeutic exercise;Balance  training;Neuromuscular re-education;Patient/family education;Manual techniques;Scar mobilization;Passive range of motion;Dry needling;Taping    PT Next Visit Plan  Discharged    PT Home Exercise Plan  Continue with HEP given by HHPT; 03/20/2018 - supine bridge, SLR; 2/18: SLS and bridges, desensitization stragies and scar tissue massage; 05/02/18: Step ups x10, hip abduction/extension x 10 with RTB all 1x/day    Consulted and Agree with Plan of Care  Patient       Patient will benefit from skilled therapeutic intervention in order to improve the following deficits and impairments:  Abnormal gait, Decreased balance, Decreased endurance, Decreased mobility, Difficulty walking, Hypomobility, Decreased range of motion, Improper body mechanics, Decreased activity tolerance, Decreased strength, Pain  Visit Diagnosis: Muscle weakness (generalized)  Other abnormalities of gait and mobility  Other symptoms and signs  involving the musculoskeletal system     Problem List Patient Active Problem List   Diagnosis Date Noted  . Primary localized osteoarthritis of right hip 01/24/2018  . Plantar fasciitis 10/02/2012  . Unspecified hypothyroidism 10/02/2012  . Left lower quadrant pain 09/22/2010  . Change in bowel function 09/22/2010  . Rectal bleeding 09/22/2010  . GERD (gastroesophageal reflux disease) 09/22/2010  . Esophageal dysphagia 09/22/2010   Clarene Critchley PT, DPT 5:45 PM, 05/02/18 [place] [address] [place], [place], [address] Phone: [phone]   Fax:  [phone]  Name: Madison Aguilar MRN: [id number] Date of Birth: 08/25/1965

## 2018-05-04 ENCOUNTER — Ambulatory Visit (HOSPITAL_COMMUNITY): Payer: 59

## 2018-05-09 ENCOUNTER — Ambulatory Visit (HOSPITAL_COMMUNITY): Payer: 59 | Admitting: Physical Therapy

## 2018-05-11 ENCOUNTER — Encounter (HOSPITAL_COMMUNITY): Payer: 59 | Admitting: Physical Therapy

## 2018-05-16 ENCOUNTER — Encounter (HOSPITAL_COMMUNITY): Payer: 59 | Admitting: Physical Therapy

## 2018-09-13 ENCOUNTER — Other Ambulatory Visit: Payer: Self-pay | Admitting: Family Medicine

## 2018-09-13 NOTE — Telephone Encounter (Signed)
Please call pt and schedule office visit, then route to nurses. Thank you

## 2018-09-13 NOTE — Telephone Encounter (Signed)
Ref times one

## 2018-09-13 NOTE — Telephone Encounter (Signed)
Patient has schedule appointment for 7/31

## 2018-09-21 ENCOUNTER — Telehealth: Payer: Self-pay | Admitting: Family Medicine

## 2018-09-21 NOTE — Telephone Encounter (Signed)
error 

## 2018-09-22 ENCOUNTER — Ambulatory Visit (INDEPENDENT_AMBULATORY_CARE_PROVIDER_SITE_OTHER): Payer: 59 | Admitting: Nurse Practitioner

## 2018-09-22 ENCOUNTER — Other Ambulatory Visit: Payer: Self-pay

## 2018-09-22 DIAGNOSIS — E039 Hypothyroidism, unspecified: Secondary | ICD-10-CM | POA: Diagnosis not present

## 2018-09-22 DIAGNOSIS — R21 Rash and other nonspecific skin eruption: Secondary | ICD-10-CM

## 2018-09-22 DIAGNOSIS — M2550 Pain in unspecified joint: Secondary | ICD-10-CM | POA: Diagnosis not present

## 2018-09-22 DIAGNOSIS — Z13220 Encounter for screening for lipoid disorders: Secondary | ICD-10-CM

## 2018-09-22 DIAGNOSIS — R5383 Other fatigue: Secondary | ICD-10-CM | POA: Diagnosis not present

## 2018-09-22 NOTE — Progress Notes (Signed)
Subjective:  VIRTUAL VISIT  Patient ID: Madison Aguilar, female    DOB: 1965/05/08, 53 y.o.   MRN: [id number]  HPI  Patient calls for a follow up on thyroid. Patient states she has been feeling tired lately but not sure if related to thyroid.  Patient also states she is having issues with a rash for a few weeks. Started in back of head and now on chest  Virtual Visit via Video Note  I connected with Dallie B Lozano on 09/22/18 at 11:00 AM EDT by a video enabled telemedicine application and verified that I am speaking with the correct person using two identifiers.  Location: Patient: home Provider: office   I discussed the limitations of evaluation and management by telemedicine and the availability of in person appointments. The patient expressed understanding and agreed to proceed.  History of Present Illness: Presents for recheck of her thyroid. Is due for lab work. Also mentions a rash that started on the back of the neck which has improved but now having it on the upper anterior chest. Began about 3 weeks ago. No history of tick bites but works outside frequently in her garden. Very pruritic at times. No fever no known contacts. Saw her dermatologist about 6 months ago for a recurrent rash on her hands. Was told most likely allergy. Has a steroid cream which applies at times which as helped. Had a total hip replacement in December. Recuperated well. Has been experiencing unusual fatigue and migratory arthralgias lately. Denies any depression symptoms. No unusual stressors. No erythema or edema of the joints.    Observations/Objective: Format  Patient present at home Provider present at office Consent for interaction obtained Coronavirus outbreak made virtual visit necessary  NAD. Alert, oriented. Difficulty assessing rash but appears to be non erythematous papular rash on posterior neck and upper chest.   Assessment and Plan: Problem List Items Addressed This Visit      Endocrine   Hypothyroidism - Primary   Relevant Orders   TSH    Other Visit Diagnoses    Fatigue, unspecified type       Relevant Orders   VITAMIN D 25 Hydroxy (Vit-D Deficiency, Fractures)   Multiple joint pain       Relevant Orders   CBC with Differential/Platelet   Comprehensive metabolic panel   Rheumatoid Factor   ANA   B. burgdorfi antibodies   Screening, lipid       Relevant Orders   Lipid panel   Rash and nonspecific skin eruption       Relevant Orders   ANA   B. burgdorfi antibodies       Follow Up Instructions: Continue current dose of Levothyroxine. Labs pending.  Use her current steroid cream on new rashes. Call back if worsens or persists.  Warning signs reviewed.  Reminded about preventive health physical.     I discussed the assessment and treatment plan with the patient. The patient was provided an opportunity to ask questions and all were answered. The patient agreed with the plan and demonstrated an understanding of the instructions.   The patient was advised to call back or seek an in-person evaluation if the symptoms worsen or if the condition fails to improve as anticipated.  I provided 25 minutes of non-face-to-face time during this encounter.  25 minutes was spent with the patient.  This statement verifies that 25 minutes was indeed spent with the patient.  More than 50% of this visit-total duration of the visit-was  spent in counseling and coordination of care. The issues that the patient came in for today as reflected in the diagnosis (s) please refer to documentation for further details.    Review of Systems     Objective:   Physical Exam        Assessment & Plan:

## 2018-09-23 ENCOUNTER — Encounter: Payer: Self-pay | Admitting: Nurse Practitioner

## 2018-09-27 LAB — COMPREHENSIVE METABOLIC PANEL
ALT: 8 IU/L (ref 0–32)
AST: 12 IU/L (ref 0–40)
Albumin/Globulin Ratio: 1.7 (ref 1.2–2.2)
Albumin: 4.3 g/dL (ref 3.8–4.9)
Alkaline Phosphatase: 68 IU/L (ref 39–117)
BUN/Creatinine Ratio: 10 (ref 9–23)
BUN: 9 mg/dL (ref 6–24)
Bilirubin Total: 0.2 mg/dL (ref 0.0–1.2)
CO2: 26 mmol/L (ref 20–29)
Calcium: 9.7 mg/dL (ref 8.7–10.2)
Chloride: 101 mmol/L (ref 96–106)
Creatinine, Ser: 0.87 mg/dL (ref 0.57–1.00)
GFR calc Af Amer: 87 mL/min/{1.73_m2} (ref >59)
GFR calc non Af Amer: 75 mL/min/{1.73_m2} (ref >59)
Globulin, Total: 2.5 g/dL (ref 1.5–4.5)
Glucose: 89 mg/dL (ref 65–99)
Potassium: 4.8 mmol/L (ref 3.5–5.2)
Sodium: 140 mmol/L (ref 134–144)
Total Protein: 6.8 g/dL (ref 6.0–8.5)

## 2018-09-27 LAB — CBC WITH DIFFERENTIAL/PLATELET
Basophils Absolute: 0.1 10*3/uL (ref 0.0–0.2)
Basos: 1 %
EOS (ABSOLUTE): 0.6 10*3/uL — ABNORMAL HIGH (ref 0.0–0.4)
Eos: 7 %
Hematocrit: 41 % (ref 34.0–46.6)
Hemoglobin: 13.4 g/dL (ref 11.1–15.9)
Immature Grans (Abs): 0 10*3/uL (ref 0.0–0.1)
Immature Granulocytes: 0 %
Lymphocytes Absolute: 2.2 10*3/uL (ref 0.7–3.1)
Lymphs: 26 %
MCH: 30 pg (ref 26.6–33.0)
MCHC: 32.7 g/dL (ref 31.5–35.7)
MCV: 92 fL (ref 79–97)
Monocytes Absolute: 0.6 10*3/uL (ref 0.1–0.9)
Monocytes: 7 %
Neutrophils Absolute: 4.8 10*3/uL (ref 1.4–7.0)
Neutrophils: 59 %
Platelets: 336 10*3/uL (ref 150–450)
RBC: 4.47 x10E6/uL (ref 3.77–5.28)
RDW: 13.4 % (ref 11.7–15.4)
WBC: 8.3 10*3/uL (ref 3.4–10.8)

## 2018-09-27 LAB — ANA: ANA Titer 1: NEGATIVE

## 2018-09-27 LAB — LIPID PANEL
Chol/HDL Ratio: 3.3 ratio (ref 0.0–4.4)
Cholesterol, Total: 230 mg/dL — ABNORMAL HIGH (ref 100–199)
HDL: 70 mg/dL (ref >39)
LDL Calculated: 147 mg/dL — ABNORMAL HIGH (ref 0–99)
Triglycerides: 65 mg/dL (ref 0–149)
VLDL Cholesterol Cal: 13 mg/dL (ref 5–40)

## 2018-09-27 LAB — VITAMIN D 25 HYDROXY (VIT D DEFICIENCY, FRACTURES): Vit D, 25-Hydroxy: 28.2 ng/mL — ABNORMAL LOW (ref 30.0–100.0)

## 2018-09-27 LAB — B. BURGDORFI ANTIBODIES: Lyme IgG/IgM Ab: <0.91 {ISR} (ref 0.00–0.90)

## 2018-09-27 LAB — RHEUMATOID FACTOR: Rheumatoid fact SerPl-aCnc: <10 IU/mL (ref 0.0–13.9)

## 2018-09-27 LAB — TSH: TSH: 6.39 u[IU]/mL — ABNORMAL HIGH (ref 0.450–4.500)

## 2018-10-02 ENCOUNTER — Telehealth: Payer: Self-pay | Admitting: Family Medicine

## 2018-10-02 DIAGNOSIS — E039 Hypothyroidism, unspecified: Secondary | ICD-10-CM

## 2018-10-02 MED ORDER — LEVOTHYROXINE SODIUM 88 MCG PO TABS: 88.0000 ug | ORAL_TABLET | Freq: Every day | ORAL | 1 refills | Status: DC

## 2018-10-02 NOTE — Addendum Note (Signed)
Addended by: Vicente Males on: 10/02/2018 01:23 PM   Modules accepted: Orders

## 2018-10-02 NOTE — Telephone Encounter (Signed)
Pt contacted office to check on lab results. /Pt had visit with [name] on 09/22/2018 for rash and thyroid check. Pt also had lab work done. Lab results were sent via MyChart;Notes recorded by Nilda Simmer, NP on 09/28/2018 at 6:28 PM EDT  Madison Aguilar, most of your labs are fine. Your vitamin D level is just below normal which should not be contributing to the level of fatigue you are having. Your rheumatoid, lupus and Lyme screens were all negative. Your thyroid test shows you are not taking enough thyroid medicine. Just to make sure: are you taking your thyroid med everyday? How is your rash doing? [name].  I did relay all this to patient. Pt states she is taking her thyroid medication every morning. Pt states her rash has not went away and has gotten worse. The rash in on the back of her neck, on chest and she has had some puffy water blister pop up in her mouth on the inside of the lip. Pt states that carolyn told her to take Allegra and benadryl but pt has not began taking any medication yet because she wanted to wait until the lab results came back. Please advise. Thank you

## 2018-10-02 NOTE — Telephone Encounter (Signed)
Pt contacted and verbalized understanding. Medication sent to pharmacy and lab orders placed

## 2018-10-02 NOTE — Telephone Encounter (Signed)
B W REVIEWEd, rec incr thyr to 88 mcg daily and re ck tsh in two mo. 6 mo worthTake antihist as rec, if this plus steroid cr does not calm down rash, pt will need to return to her derm

## 2018-10-16 ENCOUNTER — Telehealth (HOSPITAL_COMMUNITY): Payer: Self-pay | Admitting: Family Medicine

## 2018-10-16 ENCOUNTER — Other Ambulatory Visit: Payer: Self-pay | Admitting: Family Medicine

## 2018-10-16 NOTE — Telephone Encounter (Signed)
10/16/18  i spoke with patient and she doesn't want to schedule at this time....she wanted to wait and asked that we put on hold for now and she will call back.  I told her that I would put on hold for a month and she said that was fine.

## 2019-01-11 ENCOUNTER — Other Ambulatory Visit: Payer: Self-pay

## 2019-01-11 ENCOUNTER — Ambulatory Visit (INDEPENDENT_AMBULATORY_CARE_PROVIDER_SITE_OTHER): Payer: 59 | Admitting: Family Medicine

## 2019-01-11 DIAGNOSIS — R21 Rash and other nonspecific skin eruption: Secondary | ICD-10-CM

## 2019-01-11 NOTE — Progress Notes (Signed)
   Subjective:    Patient ID: Madison Aguilar, female    DOB: 08/28/1965, 53 y.o.   MRN: [id number]  Rash This is a new problem. Episode onset: Tuesday  The affected locations include the right axilla (right side). The rash is characterized by itchiness, pain and redness (started out as small raised bumps, then began to get bigger and they itch). She was exposed to nothing. (Headache) Past treatments include nothing.   Virtual Visit via Video Note  I connected with [name] on 01/11/19 at 10:30 AM EST by a video enabled telemedicine application and verified that I am speaking with the correct person using two identifiers.  Location: Patient: home Provider: office   I discussed the limitations of evaluation and management by telemedicine and the availability of in person appointments. The patient expressed understanding and agreed to proceed.  History of Present Illness:    Observations/Objective:   Assessment and Plan:   Follow Up Instructions:    I discussed the assessment and treatment plan with the patient. The patient was provided an opportunity to ask questions and all were answered. The patient agreed with the plan and demonstrated an understanding of the instructions.   The patient was advised to call back or seek an in-person evaluation if the symptoms worsen or if the condition fails to improve as anticipated.  I provided  minutes of non-face-to-face time during this encounter.   Vicente Males, LPN    Review of Systems  Skin: Positive for rash.       Objective:   Physical Exam  Of note we attempted multiple times to reach patient both through computer and phone and were unsuccessful in doing this.  No charge of course for today's disrupted interaction      Assessment & Plan:

## 2019-03-14 DIAGNOSIS — F17200 Nicotine dependence, unspecified, uncomplicated: Secondary | ICD-10-CM | POA: Insufficient documentation

## 2019-03-28 ENCOUNTER — Encounter: Payer: Self-pay | Admitting: Family Medicine

## 2019-04-04 ENCOUNTER — Other Ambulatory Visit: Payer: Self-pay | Admitting: Family Medicine

## 2019-06-06 ENCOUNTER — Other Ambulatory Visit: Payer: Self-pay

## 2019-06-06 ENCOUNTER — Encounter: Payer: Self-pay | Admitting: Dermatology

## 2019-06-06 ENCOUNTER — Encounter: Payer: Self-pay | Admitting: *Deleted

## 2019-06-06 ENCOUNTER — Ambulatory Visit: Payer: 59 | Admitting: Dermatology

## 2019-06-06 DIAGNOSIS — D485 Neoplasm of uncertain behavior of skin: Secondary | ICD-10-CM

## 2019-06-06 DIAGNOSIS — D492 Neoplasm of unspecified behavior of bone, soft tissue, and skin: Secondary | ICD-10-CM

## 2019-06-06 DIAGNOSIS — L2084 Intrinsic (allergic) eczema: Secondary | ICD-10-CM | POA: Diagnosis not present

## 2019-06-06 MED ORDER — MUPIROCIN 2 % EX OINT: 1.0000 "application " | TOPICAL_OINTMENT | Freq: Two times a day (BID) | CUTANEOUS | 0 refills | Status: DC

## 2019-06-06 NOTE — Patient Instructions (Signed)

## 2019-06-10 ENCOUNTER — Encounter: Payer: Self-pay | Admitting: Dermatology

## 2019-06-10 NOTE — Progress Notes (Addendum)
   Follow-Up Visit   Subjective  Madison Aguilar is a 54 y.o. female who presents for the following: Rash (hands for 2 months-  itch- using augmented betamethasone cream).  Growth Location: right shin Duration: 6 months Quality: Increased size Associated Signs/Symptoms: Modifying Factors:  Severity:  Timing: Context:   The following portions of the chart were reviewed this encounter and updated as appropriate: Tobacco  Allergies  Meds  Problems  Med Hx  Surg Hx  Fam Hx      Objective  Well appearing patient in no apparent distress; mood and affect are within normal limits.  A focused examination was performed including hands, arms, face, legs. Relevant physical exam findings are noted in the Assessment and Plan.   Assessment & Plan  Intrinsic eczema (2) Left Hand - Posterior; Right Hand - Posterior  Schedule patch testing  Other Related Procedures Patch Test  Neoplasm of skin Right Lower Leg - Anterior  Skin / nail biopsy Type of biopsy: tangential   Informed consent: discussed and consent obtained   Timeout: patient name, date of birth, surgical site, and procedure verified   Procedure prep:  Patient was prepped and draped in usual sterile fashion Prep type:  Chlorhexidine Anesthesia: the lesion was anesthetized in a standard fashion   Anesthetic:  1% lidocaine w/ epinephrine 1-100,000 local infiltration Instrument used: flexible razor blade   Hemostasis achieved with: ferric subsulfate   Outcome: patient tolerated procedure well   Post-procedure details: sterile dressing applied and wound care instructions given   Dressing type: petrolatum   Additional details:  Patient identified lesion of concern.  Lesion identified by physician.  Specimen 1 - Surgical pathology Differential Diagnosis: ro bcc scc Check Margins: No

## 2019-06-11 ENCOUNTER — Telehealth: Payer: Self-pay

## 2019-06-11 NOTE — Telephone Encounter (Signed)
-----   Message from Lavonna Monarch, MD sent at 06/08/2019  5:08 PM EDT ----- Please inform Madison Aguilar biopsy shows at least a thick pre-cancer, possibly low risk cancer. Dr. Darene Lamer wants her to apply imiquimod M-W-F for 6 weeks or until she sees brisk inflammation. Wait one month to start.

## 2019-06-11 NOTE — Telephone Encounter (Signed)
Phone call to patient with her Pathology results and Dr. Onalee Hua recommendations.  Patient aware of Dr. Onalee Hua recommendations.

## 2019-06-22 ENCOUNTER — Ambulatory Visit: Payer: 59 | Admitting: Dermatology

## 2019-07-05 ENCOUNTER — Other Ambulatory Visit: Payer: Self-pay

## 2019-07-05 ENCOUNTER — Ambulatory Visit: Payer: 59 | Admitting: Family Medicine

## 2019-07-05 ENCOUNTER — Other Ambulatory Visit: Payer: Self-pay | Admitting: Family Medicine

## 2019-07-05 ENCOUNTER — Encounter: Payer: Self-pay | Admitting: Family Medicine

## 2019-07-05 VITALS — BP 130/72 | Temp 97.6°F | Wt 157.4 lb

## 2019-07-05 DIAGNOSIS — Z79899 Other long term (current) drug therapy: Secondary | ICD-10-CM | POA: Diagnosis not present

## 2019-07-05 DIAGNOSIS — F411 Generalized anxiety disorder: Secondary | ICD-10-CM

## 2019-07-05 DIAGNOSIS — E039 Hypothyroidism, unspecified: Secondary | ICD-10-CM | POA: Diagnosis not present

## 2019-07-05 DIAGNOSIS — E785 Hyperlipidemia, unspecified: Secondary | ICD-10-CM | POA: Diagnosis not present

## 2019-07-05 MED ORDER — ESCITALOPRAM OXALATE 10 MG PO TABS
10.0000 mg | ORAL_TABLET | Freq: Every morning | ORAL | 1 refills | Status: DC
Start: 2019-07-05 — End: 2019-08-08

## 2019-07-05 NOTE — Patient Instructions (Signed)

## 2019-07-05 NOTE — Progress Notes (Signed)
   Subjective:    Patient ID: Madison Aguilar, female    DOB: July 25, 1965, 54 y.o.   MRN: [id number]  HPI Pt had rash on both hands. Has been there about one month but is better today. Pt has been seeing Dr.Tafeen for skin cancer also. Not been exposed to anything new that she know of.   Pt states that she feels tired all the time, doesn't want to do anything, snaps at people   Ringing in th ear going onfor a long time.notes pressure at times, no pain. Not hearing as well as once did . Constant ringing in ear.   Pt states there is a recall on levothyroxine and is wanting to make sure it is ok to continue.    Anxiety and stress deveoping over hte past four  Review of Systems No headache no chest pain no shortness of breath    Objective:   Physical Exam  Alert active TMs normal pharynx normal thyroid nonpalpable.  Lungs clear.  Heart regular in rhythm.      Assessment & Plan:  mpression 1 generalized anxiety disorder.  Very long discussion held regarding nature of this.  Including on her ED underlying etiology.  Also including potential therapies.  Exercise encouraged.  Benefits of medicine discussed.  Would like to avoid benzodiazepines if at all possible.  Lexapro specifically discussed.  Questions answered.  Options discussed will initiate Lexapro 10 every morning.   2.  Hypothyroidism.  Status uncertain  3.  Fatigue likely relates 1  Exercise strongly encouraged.  Initiate Lexapro.  Educational formation given.  Appropriate blood work  Greater than 50% of this30 minute face to face visit was spent in counseling and discussion and coordination of care regarding the above diagnosis/diagnosies

## 2019-07-12 ENCOUNTER — Telehealth: Payer: Self-pay | Admitting: Dermatology

## 2019-07-12 NOTE — Telephone Encounter (Signed)
Patient is calling for results of biopsy and says to leave detailed message if does not answer. Chart [id number]

## 2019-07-12 NOTE — Telephone Encounter (Signed)
Phone call to patient with her results. Voicemail left with detailed results per patient's request.

## 2019-07-16 ENCOUNTER — Ambulatory Visit: Payer: 59 | Admitting: Dermatology

## 2019-07-18 ENCOUNTER — Ambulatory Visit: Payer: 59

## 2019-07-20 ENCOUNTER — Ambulatory Visit: Payer: 59 | Admitting: Dermatology

## 2019-07-31 LAB — CBC WITH DIFFERENTIAL/PLATELET
Basophils Absolute: 0.1 10*3/uL (ref 0.0–0.2)
Basos: 1 %
EOS (ABSOLUTE): 0.5 10*3/uL — ABNORMAL HIGH (ref 0.0–0.4)
Eos: 6 %
Hematocrit: 40.5 % (ref 34.0–46.6)
Hemoglobin: 13.6 g/dL (ref 11.1–15.9)
Immature Grans (Abs): 0 10*3/uL (ref 0.0–0.1)
Immature Granulocytes: 0 %
Lymphocytes Absolute: 1.9 10*3/uL (ref 0.7–3.1)
Lymphs: 23 %
MCH: 30.6 pg (ref 26.6–33.0)
MCHC: 33.6 g/dL (ref 31.5–35.7)
MCV: 91 fL (ref 79–97)
Monocytes Absolute: 0.6 10*3/uL (ref 0.1–0.9)
Monocytes: 7 %
Neutrophils Absolute: 5.2 10*3/uL (ref 1.4–7.0)
Neutrophils: 63 %
Platelets: 349 10*3/uL (ref 150–450)
RBC: 4.45 x10E6/uL (ref 3.77–5.28)
RDW: 12.6 % (ref 11.7–15.4)
WBC: 8.3 10*3/uL (ref 3.4–10.8)

## 2019-07-31 LAB — T4: T4, Total: 9.3 ug/dL (ref 4.5–12.0)

## 2019-07-31 LAB — HEPATIC FUNCTION PANEL
ALT: 10 IU/L (ref 0–32)
AST: 12 IU/L (ref 0–40)
Albumin: 4.4 g/dL (ref 3.8–4.9)
Alkaline Phosphatase: 70 IU/L (ref 48–121)
Bilirubin Total: 0.3 mg/dL (ref 0.0–1.2)
Bilirubin, Direct: 0.09 mg/dL (ref 0.00–0.40)
Total Protein: 6.7 g/dL (ref 6.0–8.5)

## 2019-07-31 LAB — BASIC METABOLIC PANEL
BUN/Creatinine Ratio: 12 (ref 9–23)
BUN: 11 mg/dL (ref 6–24)
CO2: 25 mmol/L (ref 20–29)
Calcium: 9.7 mg/dL (ref 8.7–10.2)
Chloride: 101 mmol/L (ref 96–106)
Creatinine, Ser: 0.9 mg/dL (ref 0.57–1.00)
GFR calc Af Amer: 83 mL/min/{1.73_m2} (ref >59)
GFR calc non Af Amer: 72 mL/min/{1.73_m2} (ref >59)
Glucose: 96 mg/dL (ref 65–99)
Potassium: 4.8 mmol/L (ref 3.5–5.2)
Sodium: 141 mmol/L (ref 134–144)

## 2019-07-31 LAB — LIPID PANEL
Chol/HDL Ratio: 3.4 ratio (ref 0.0–4.4)
Cholesterol, Total: 216 mg/dL — ABNORMAL HIGH (ref 100–199)
HDL: 64 mg/dL (ref >39)
LDL Chol Calc (NIH): 136 mg/dL — ABNORMAL HIGH (ref 0–99)
Triglycerides: 92 mg/dL (ref 0–149)
VLDL Cholesterol Cal: 16 mg/dL (ref 5–40)

## 2019-07-31 LAB — TSH: TSH: 0.428 u[IU]/mL — ABNORMAL LOW (ref 0.450–4.500)

## 2019-08-02 ENCOUNTER — Other Ambulatory Visit: Payer: Self-pay | Admitting: *Deleted

## 2019-08-02 DIAGNOSIS — E039 Hypothyroidism, unspecified: Secondary | ICD-10-CM

## 2019-08-02 MED ORDER — LEVOTHYROXINE SODIUM 75 MCG PO TABS: 75.0000 ug | ORAL_TABLET | Freq: Every day | ORAL | 0 refills | Status: DC

## 2019-08-02 NOTE — Progress Notes (Signed)
Labs- showing low TSH at 0.428, appears in chart has gone back and forth last years on 75 and 19mcg.   Will decrease synthroid to 63mcg. Follow up in 6 wks for recheck of tsh.   Dr. Lovena Le

## 2019-08-02 NOTE — Addendum Note (Signed)
Addended by: Erven Colla on: 08/02/2019 08:53 AM   Modules accepted: Orders

## 2019-08-08 ENCOUNTER — Other Ambulatory Visit: Payer: Self-pay

## 2019-08-08 ENCOUNTER — Ambulatory Visit: Payer: 59 | Admitting: Family Medicine

## 2019-08-08 ENCOUNTER — Encounter: Payer: Self-pay | Admitting: Family Medicine

## 2019-08-08 VITALS — BP 122/72 | Temp 97.7°F | Ht 64.0 in | Wt 157.6 lb

## 2019-08-08 DIAGNOSIS — M67819 Other specified disorders of synovium and tendon, unspecified shoulder: Secondary | ICD-10-CM

## 2019-08-08 MED ORDER — DICLOFENAC SODIUM 75 MG PO TBEC: 75.0000 mg | DELAYED_RELEASE_TABLET | Freq: Two times a day (BID) | ORAL | 0 refills | Status: DC

## 2019-08-08 NOTE — Progress Notes (Signed)
° °  Subjective:    Patient ID: Madison Aguilar, female    DOB: 1966/01/24, 54 y.o.   MRN: [id number]  HPI   Patient arrives with right shoulder pain that started this am. Patient states she woke up with limited mobility and pain in her right shoulder this am. No know injury.  Significant anterior right shoulder pain discomfort hurts with certain movements hurts to raise her arm.  Hurts to put her arm behind her back.  Denies any injury denies any repetitive use. Review of Systems Please see above    Objective:   Physical Exam Lungs clear respiratory rate normal heart regular no murmurs tender anterior right shoulder limited range of motion in regards to putting it behind her back consistent with rotator cuff tendinitis       Assessment & Plan:  Rotator cuff tendinosis Anti-inflammatory twice daily Gentle range of motion exercises Rehabilitation pamphlet printed out given to patient If not seeing significant improvement over the next 5 days consider injection of steroids Hold off on physical therapy currently I do not find evidence of a rotator cuff tear so I do not feel patient needs to go to Ortho just yet Hold off on x-ray currently but if not improving by next week x-ray indicated There could be some element of impingement due to the tendinosis

## 2019-09-04 ENCOUNTER — Encounter: Payer: Self-pay | Admitting: Family Medicine

## 2019-09-04 ENCOUNTER — Other Ambulatory Visit: Payer: Self-pay

## 2019-09-04 ENCOUNTER — Ambulatory Visit: Payer: 59 | Admitting: Family Medicine

## 2019-09-04 DIAGNOSIS — E039 Hypothyroidism, unspecified: Secondary | ICD-10-CM | POA: Diagnosis not present

## 2019-09-04 LAB — TSH: TSH: 1.29 u[IU]/mL (ref 0.450–4.500)

## 2019-09-04 MED ORDER — LEVOTHYROXINE SODIUM 75 MCG PO TABS: 75.0000 ug | ORAL_TABLET | Freq: Every day | ORAL | 1 refills | Status: DC

## 2019-09-04 NOTE — Progress Notes (Signed)
Patient ID: Madison Aguilar, female    DOB: May 27, 1965, 54 y.o.   MRN: [id number]   Chief Complaint  Patient presents with  . Hypothyroidism   Subjective:    HPI  hypothyroidism. Takes levothyroxine 75 mcg one daily.  - had some 11mcg synthroid left over and took 2 days of this dose, before appt.  Pt states no concerns today.  Pt seen for hypothyroidism. Pt is on 38mcg synthroid.  Taking it for the past month.  Gyn- last year had pap smear. -dr, Deatra Ina retired, gyn.  mammo normal 2/21. Pap-normal per pat- 2/21.   Pt wasn't sleeping, mind racing, and was given lexapro 10mg , but didn't take it.  Medical History Madison Aguilar has a past medical history of Arthritis, Diverticulosis (09/2010), GERD (gastroesophageal reflux disease), History of anemia, History of colon polyps (09/2010), Hypothyroid, Low blood pressure, Migraine headache, Plantar fasciitis, Squamous cell carcinoma of skin (11/12/2015), Tinnitus, bilateral, and Varicose vein of leg.   Outpatient Encounter Medications as of 09/04/2019  Medication Sig  . levothyroxine (SYNTHROID) 75 MCG tablet Take 1 tablet (75 mcg total) by mouth daily.  . [DISCONTINUED] levothyroxine (SYNTHROID) 75 MCG tablet Take 1 tablet (75 mcg total) by mouth daily.  . [DISCONTINUED] diclofenac (VOLTAREN) 75 MG EC tablet Take 1 tablet (75 mg total) by mouth 2 (two) times daily.  . [DISCONTINUED] mupirocin ointment (BACTROBAN) 2 % Apply 1 application topically 2 (two) times daily.   No facility-administered encounter medications on file as of 09/04/2019.    Review of Systems  Constitutional: Negative for chills and fever.  HENT: Negative for congestion, rhinorrhea and sore throat.   Respiratory: Negative for cough, shortness of breath and wheezing.   Cardiovascular: Negative for chest pain and leg swelling.  Gastrointestinal: Negative for abdominal pain, diarrhea, nausea and vomiting.  Genitourinary: Negative for dysuria and frequency.    Musculoskeletal: Negative for arthralgias and back pain.  Skin: Negative for rash.  Neurological: Negative for dizziness, weakness and headaches.     Vitals BP 102/60   Pulse (!) 58   Temp (!) 97.5 F (36.4 C)   Ht 5\' 4"  (1.626 m)   Wt 158 lb (71.7 kg)   SpO2 98%   BMI 27.12 kg/m   Objective:   Physical Exam Vitals and nursing note reviewed.  Constitutional:      Appearance: Normal appearance.  HENT:     Head: Normocephalic and atraumatic.     Nose: Nose normal.     Mouth/Throat:     Mouth: Mucous membranes are moist.     Pharynx: Oropharynx is clear.  Eyes:     Extraocular Movements: Extraocular movements intact.     Conjunctiva/sclera: Conjunctivae normal.     Pupils: Pupils are equal, round, and reactive to light.  Cardiovascular:     Rate and Rhythm: Regular rhythm. Bradycardia present.     Pulses: Normal pulses.     Heart sounds: Normal heart sounds.  Pulmonary:     Effort: Pulmonary effort is normal.     Breath sounds: Normal breath sounds. No wheezing, rhonchi or rales.  Musculoskeletal:        General: Normal range of motion.     Right lower leg: No edema.     Left lower leg: No edema.  Skin:    General: Skin is warm and dry.     Findings: No lesion or rash.  Neurological:     General: No focal deficit present.     Mental Status: She is  alert and oriented to person, place, and time.  Psychiatric:        Mood and Affect: Mood normal.        Behavior: Behavior normal.      Assessment and Plan   1. Hypothyroidism, unspecified type - levothyroxine (SYNTHROID) 75 MCG tablet; Take 1 tablet (75 mcg total) by mouth daily.  Dispense: 90 tablet; Refill: 1   TSH- 1.29, on 09/03/19.  Hypothyroidism- stable, cont meds at 50mcg daily. Recheck in 1 yr.  Reviewed covid vaccine status and discussed whether she should get the vaccine or wait.   F/u 1 yr or prn.

## 2019-11-09 ENCOUNTER — Ambulatory Visit
Admission: EM | Admit: 2019-11-09 | Discharge: 2019-11-09 | Disposition: A | Discharge status: Home / Self Care | Payer: 59 | Attending: Emergency Medicine | Admitting: Emergency Medicine

## 2019-11-09 ENCOUNTER — Other Ambulatory Visit: Payer: Self-pay

## 2019-11-09 DIAGNOSIS — Z1152 Encounter for screening for COVID-19: Secondary | ICD-10-CM

## 2019-11-12 LAB — NOVEL CORONAVIRUS, NAA: SARS-CoV-2, NAA: NOT DETECTED

## 2019-12-04 IMAGING — DX DG CHEST 2V
2 series · 2 of 2 positions shown · non-contrast
Comparison: Chest x-ray dated 05/17/2016.

CLINICAL DATA: Pre op hip arthroplasty, pt denies any chest
complaints today. Hx smoker

EXAM:
CHEST - 2 VIEW

[chest pa]
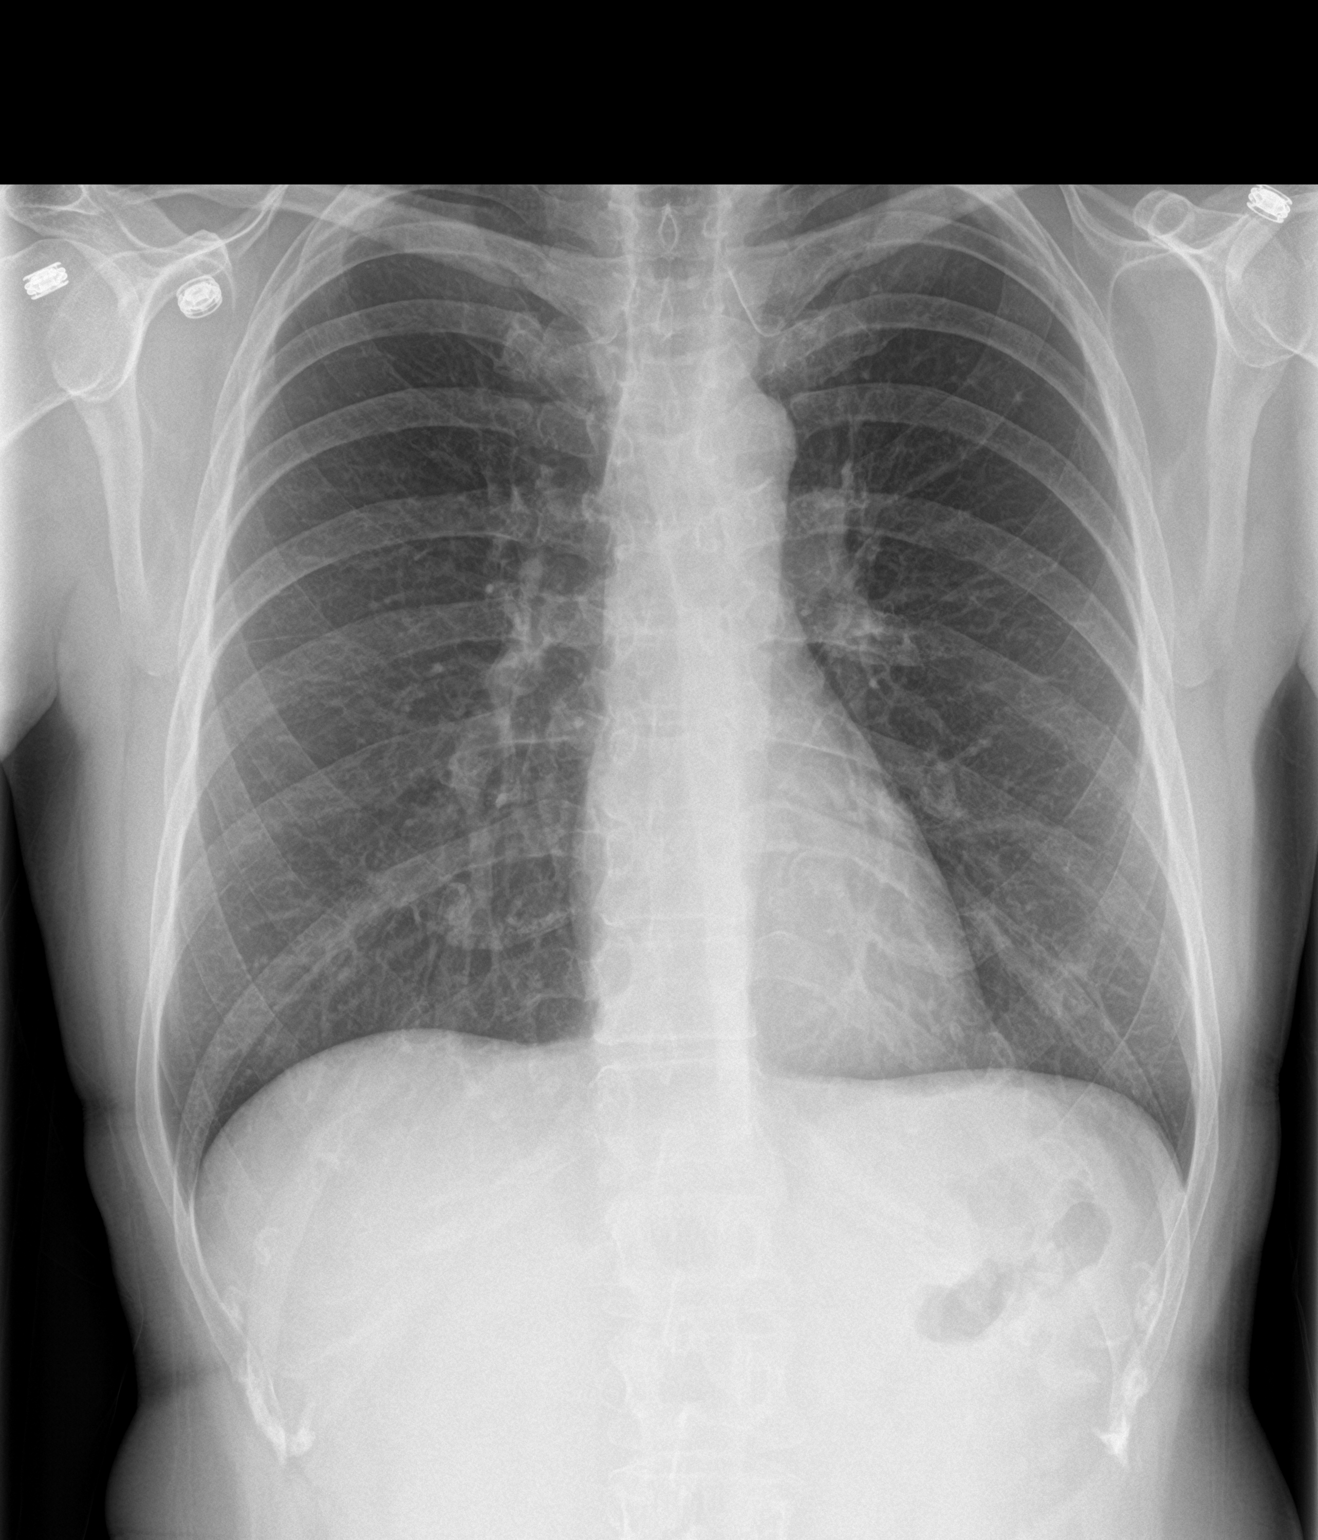

[chest lat]
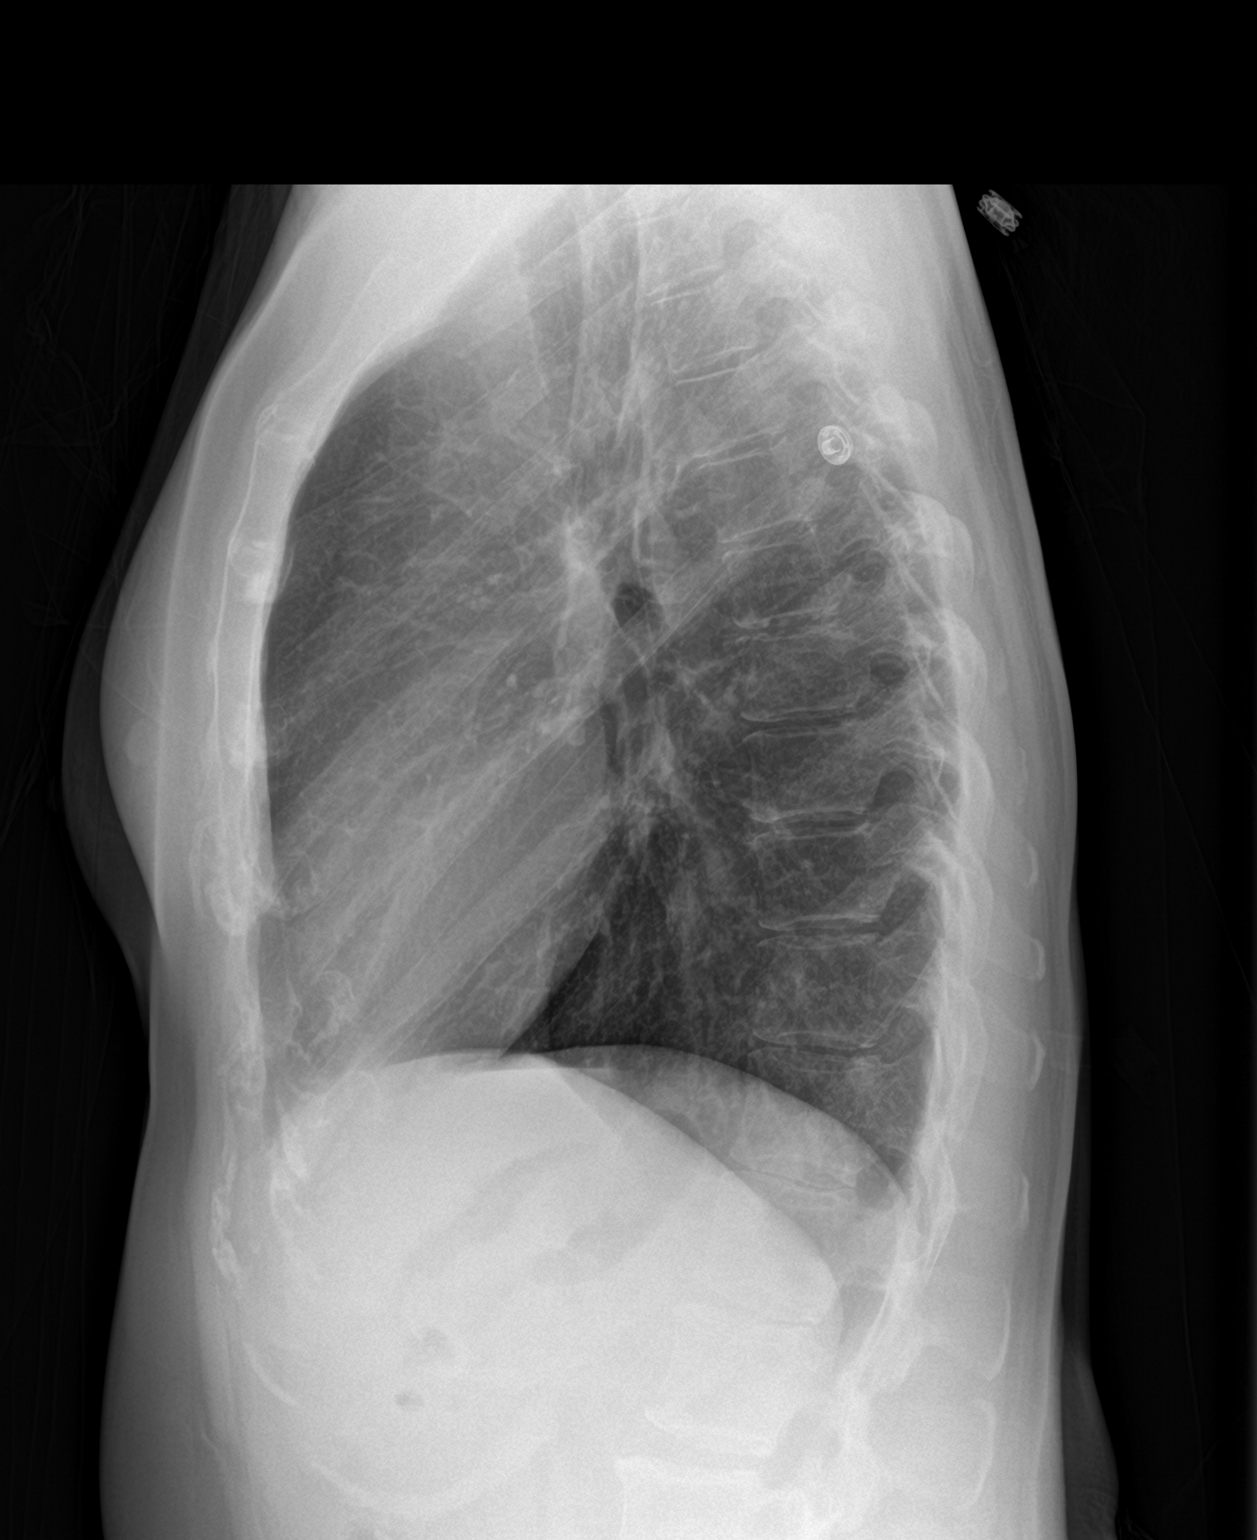

[2 of 2 positions shown; findings below may reference images not displayed]

FINDINGS: The heart size and mediastinal contours are within normal limits.
Both lungs are clear. The visualized skeletal structures are
unremarkable.
IMPRESSION: No active cardiopulmonary disease.

## 2019-12-11 IMAGING — DX DG HIP (WITH OR WITHOUT PELVIS) 1V PORT*R*
1 series · 1 of 1 positions shown · non-contrast
Comparison: None.

CLINICAL DATA: Total hip arthroplasty.

EXAM:
DG HIP (WITH OR WITHOUT PELVIS) 1V PORT RIGHT

[hip ap]
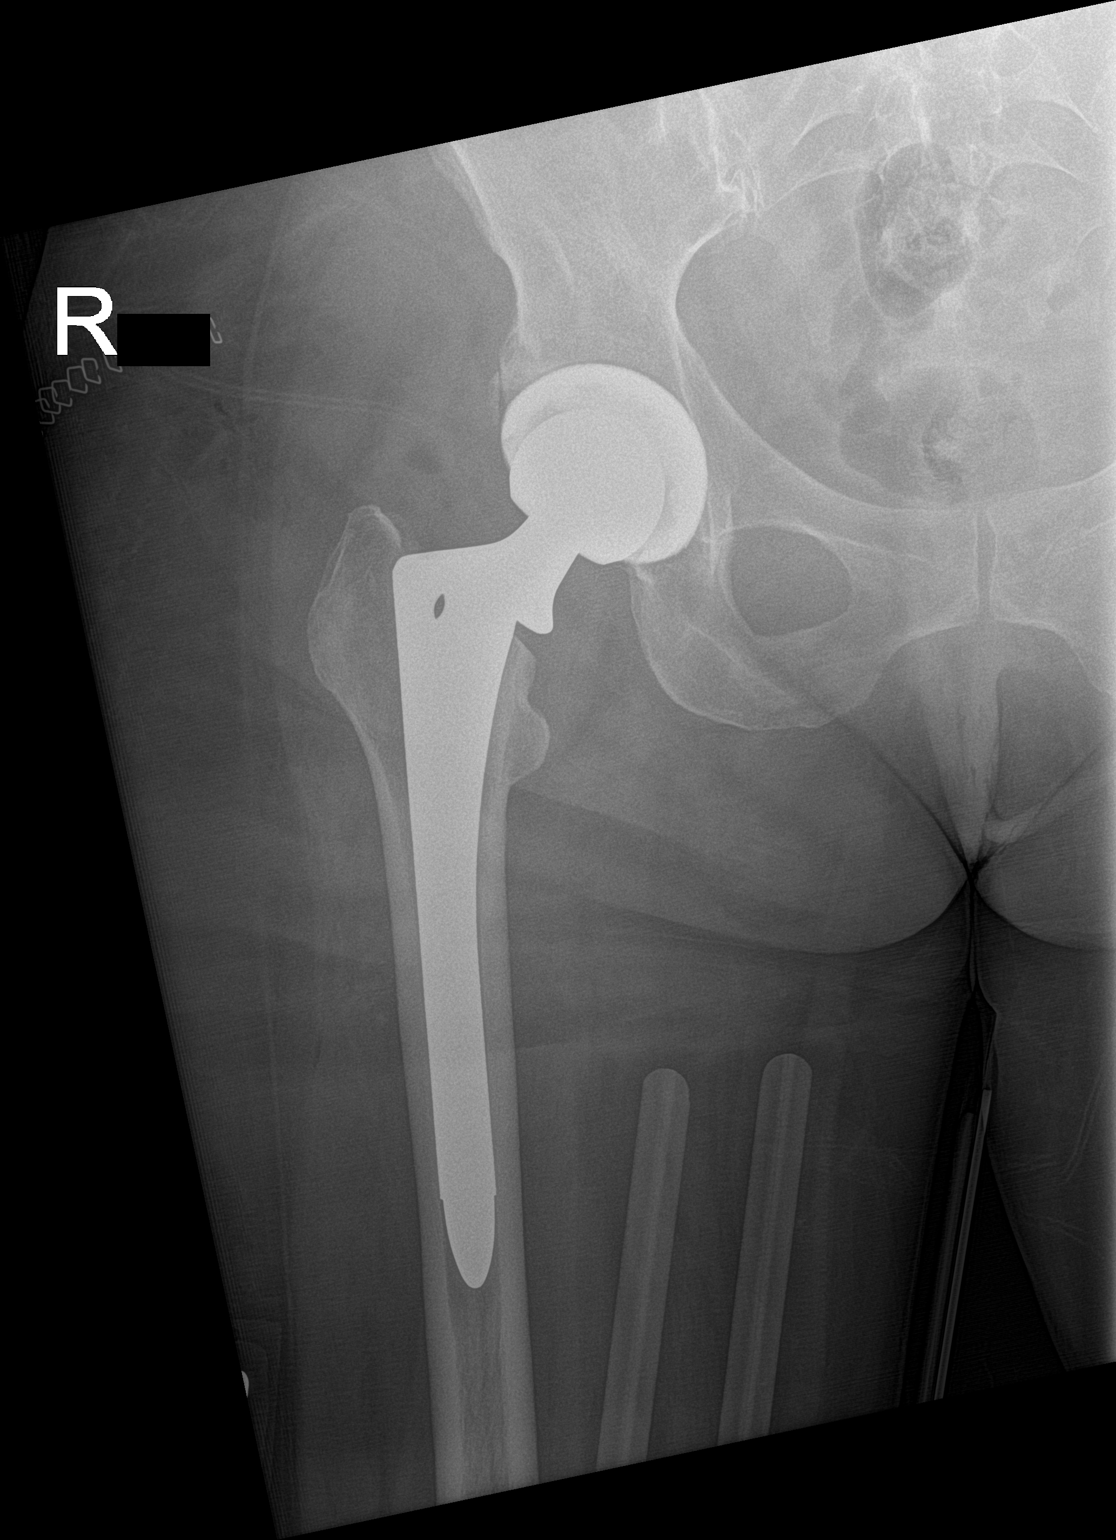

[1 of 1 positions shown; findings below may reference images not displayed]

FINDINGS: Total hip arthroplasty has a good appearance. Components appear well
positioned. No radiographically detectable complication.
IMPRESSION: Good appearance following total hip arthroplasty.

## 2019-12-11 IMAGING — DX DG PORTABLE PELVIS
1 series · 1 of 1 positions shown · non-contrast
Comparison: None.

CLINICAL DATA: Right hip replacement

EXAM:
PORTABLE PELVIS 1-2 VIEWS

[pelvis ap]
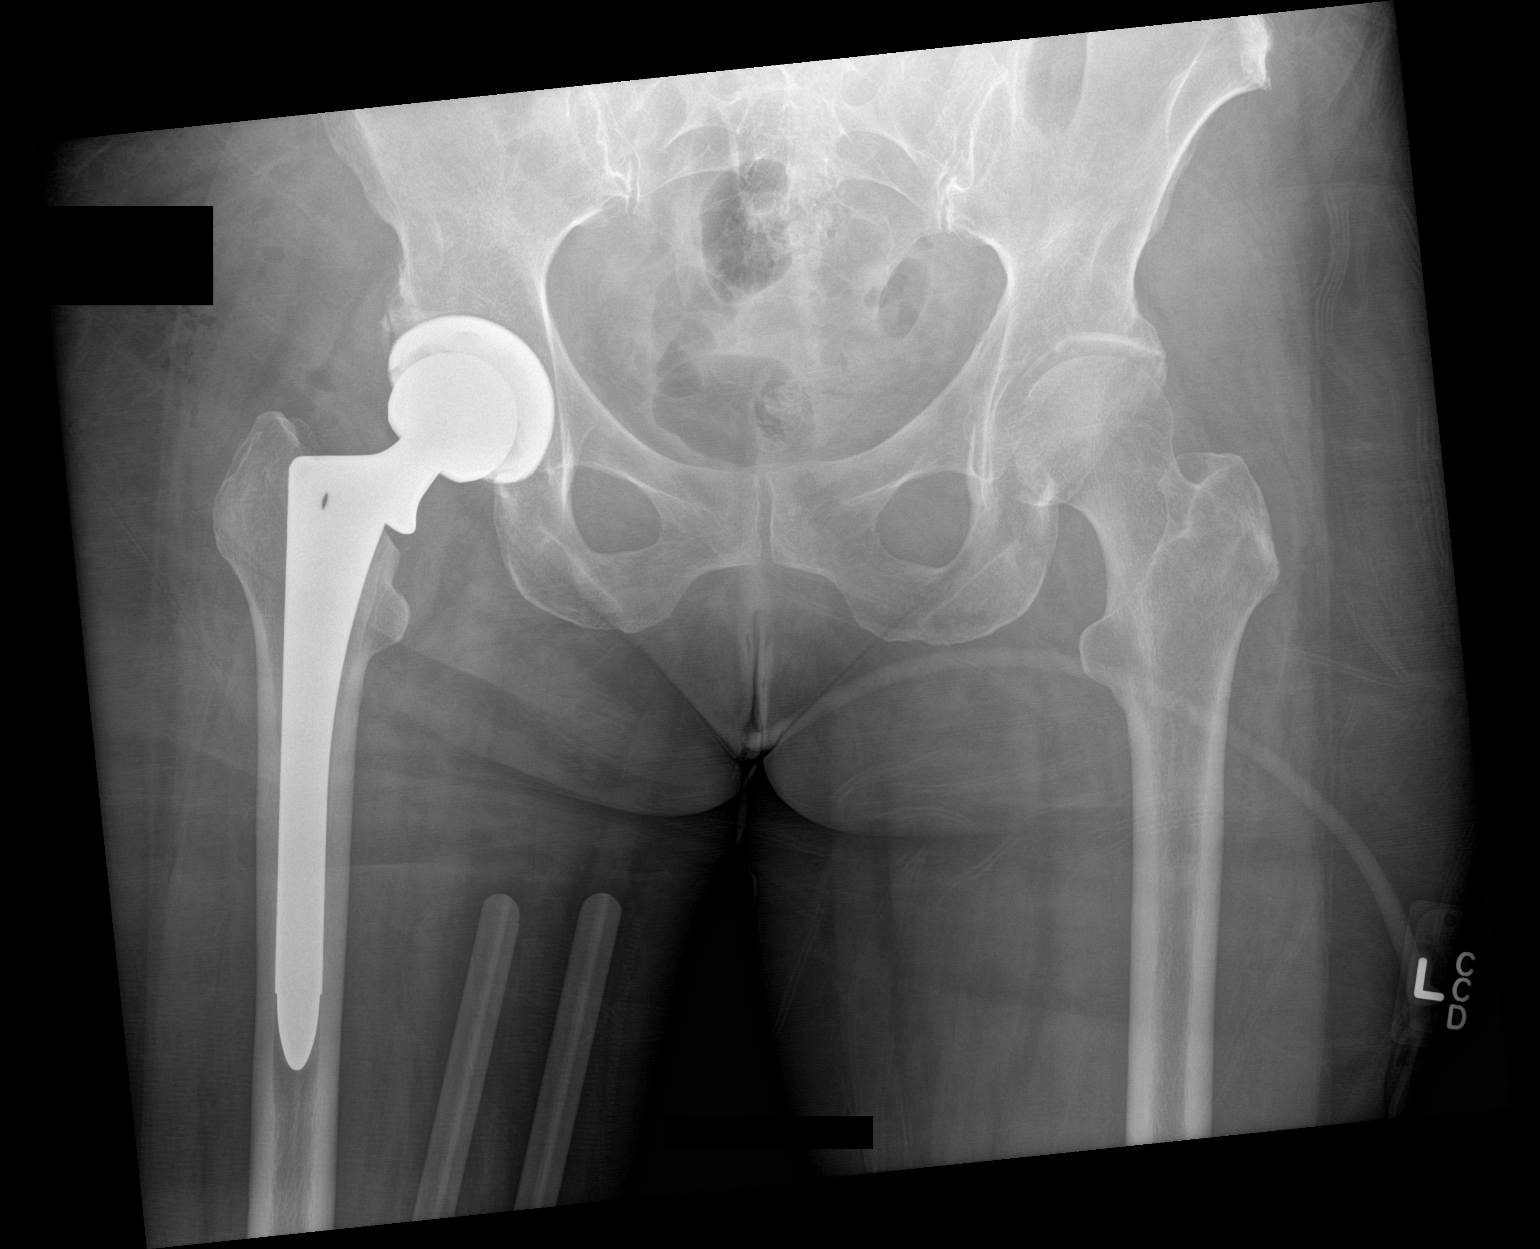

[1 of 1 positions shown; findings below may reference images not displayed]

FINDINGS: Total hip arthroplasty on the right has a good appearance. No
radiographically detectable complication.
IMPRESSION: Good appearance following total hip arthroplasty on the right.

## 2020-02-11 ENCOUNTER — Ambulatory Visit
Admission: EM | Admit: 2020-02-11 | Discharge: 2020-02-11 | Disposition: A | Discharge status: Home / Self Care | Payer: 59 | Attending: Urgent Care | Admitting: Urgent Care

## 2020-02-11 ENCOUNTER — Other Ambulatory Visit: Payer: Self-pay

## 2020-02-11 ENCOUNTER — Telehealth: Payer: Self-pay

## 2020-02-11 DIAGNOSIS — Z1152 Encounter for screening for COVID-19: Secondary | ICD-10-CM

## 2020-02-11 NOTE — Telephone Encounter (Signed)
Pt has question about covid she was fully vaccinated does not have symptoms she was in direct contract with them.   Pt call back [phone]

## 2020-02-11 NOTE — Telephone Encounter (Signed)
Patient was exposed Friday at a family function- found out this am- patient states she didn't hug the person or shake their hand was at function approx 2 hours but not in close contact the whole time and is fully vaccinated. Patient states she feels fine with no symptoms and wants to return to work tomorrow and sees no need to quarantine. Advised patient of need to quarantine and keep appt tomorrow to discuss with Santiago Glad NP.

## 2020-02-12 ENCOUNTER — Telehealth (INDEPENDENT_AMBULATORY_CARE_PROVIDER_SITE_OTHER): Payer: 59 | Admitting: Family Medicine

## 2020-02-12 ENCOUNTER — Other Ambulatory Visit: Payer: Self-pay

## 2020-02-12 ENCOUNTER — Encounter: Payer: Self-pay | Admitting: Family Medicine

## 2020-02-12 ENCOUNTER — Telehealth: Payer: Self-pay | Admitting: Family Medicine

## 2020-02-12 DIAGNOSIS — Z20822 Contact with and (suspected) exposure to covid-19: Secondary | ICD-10-CM | POA: Insufficient documentation

## 2020-02-12 NOTE — Progress Notes (Signed)
Pt was exposed to [place] Friday night. Pt reached out to HR and they informed her to get in touch with PCP to get guidance. Pt states she is fully vaccinated and works with public daily. Not having any symptoms. Feels fine.   Virtual Visit via Telephone Note  I connected with Madison Aguilar on 02/12/20 at 10:00 AM EST by telephone and verified that I am speaking with the correct person using two identifiers.  Location: Patient: home Provider: office   I discussed the limitations, risks, security and privacy concerns of performing an evaluation and management service by telephone and the availability of in person appointments. I also discussed with the patient that there may be a patient responsible charge related to this service. The patient expressed understanding and agreed to proceed.   History of Present Illness:    Observations/Objective:   Assessment and Plan:   Follow Up Instructions:    I discussed the assessment and treatment plan with the patient. The patient was provided an opportunity to ask questions and all were answered. The patient agreed with the plan and demonstrated an understanding of the instructions.   The patient was advised to call back or seek an in-person evaluation if the symptoms worsen or if the condition fails to improve as anticipated.  I provided 14 minutes of non-face-to-face time during this encounter.     Patient ID: Madison Aguilar, female    DOB: Apr 18, 1965, 54 y.o.   MRN: [id number]   Chief Complaint  Patient presents with  . Covid Exposure   Subjective:  CC: Covid exposure   This is a new problem.  Presents today via telephone call with the complaint of a direct exposure to Covid infection on Friday night.  She was at a family gathering in  a large room, and found out that her cousin tested positive for Covid.  She reports he did not have direct contact with his cousin but was in the same room for 2 hours.  She is having no symptoms, went  to the urgent care yesterday for Covid testing, the results are still pending.  She has been vaccinated, not yet time for her booster dose.  No fever, no chills, no other symptoms of COVID-19 infection.    Medical History Madison Aguilar has a past medical history of Arthritis, Diverticulosis (09/2010), GERD (gastroesophageal reflux disease), History of anemia, History of colon polyps (09/2010), Hypothyroid, Low blood pressure, Migraine headache, Plantar fasciitis, Squamous cell carcinoma of skin (11/12/2015), Tinnitus, bilateral, and Varicose vein of leg.   Outpatient Encounter Medications as of 02/12/2020  Medication Sig  . levothyroxine (SYNTHROID) 75 MCG tablet Take 1 tablet (75 mcg total) by mouth daily.   No facility-administered encounter medications on file as of 02/12/2020.     Review of Systems  Constitutional: Negative for chills and fever.  Respiratory: Negative for shortness of breath.   Cardiovascular: Negative for chest pain.     Vitals There were no vitals taken for this visit. unable Objective:   Physical Exam  Unable-  Assessment and Plan   1. Exposure to confirmed case of COVID-19   Pending Covid test from the urgent care from yesterday.  She feels certain that once this result comes back, if negative, this will be adequate for her work requirement.  She will let us know if she needs [place] to send a return to work after a negative result.  Quarantine instructions reviewed, explained that even after a negative result, she could still develop  a positive Covid infection.  Also explained that even after a negative result if she were to develop any symptoms at all she would need to quarantine.  Agrees with plan of care discussed today. Understands warning signs to seek further care: Fever, chest pain, shortness of breath, any significant change in health. Understands to follow-up if a return to work note is required, for any other needs.    Madison Ades,  FNP-C 02/12/2020

## 2020-02-12 NOTE — Telephone Encounter (Signed)
Ms. mattilyn, crites are scheduled for a virtual visit with your provider today.    Just as we do with appointments in the office, we must obtain your consent to participate.  Your consent will be active for this visit and any virtual visit you may have with one of our providers in the next 365 days.    If you have a MyChart account, I can also send a copy of this consent to you electronically.  All virtual visits are billed to your insurance company just like a traditional visit in the office.  As this is a virtual visit, video technology does not allow for your provider to perform a traditional examination.  This may limit your provider's ability to fully assess your condition.  If your provider identifies any concerns that need to be evaluated in person or the need to arrange testing such as labs, EKG, etc, we will make arrangements to do so.    Although advances in technology are sophisticated, we cannot ensure that it will always work on either your end or our end.  If the connection with a video visit is poor, we may have to switch to a telephone visit.  With either a video or telephone visit, we are not always able to ensure that we have a secure connection.   I need to obtain your verbal consent now.   Are you willing to proceed with your visit today?   JAILYNE CHIEFFO has provided verbal consent on 02/12/2020 for a virtual visit (video or telephone).   Vicente Males, LPN [id number]  6:83 AM

## 2020-02-14 LAB — NOVEL CORONAVIRUS, NAA: SARS-CoV-2, NAA: NOT DETECTED

## 2020-03-17 ENCOUNTER — Encounter: Payer: Self-pay | Admitting: Family Medicine

## 2020-04-04 ENCOUNTER — Other Ambulatory Visit: Payer: Self-pay | Admitting: Family Medicine

## 2020-04-04 DIAGNOSIS — E039 Hypothyroidism, unspecified: Secondary | ICD-10-CM

## 2020-05-13 ENCOUNTER — Encounter: Payer: Self-pay | Admitting: Family Medicine

## 2020-05-13 ENCOUNTER — Other Ambulatory Visit: Payer: Self-pay

## 2020-05-13 ENCOUNTER — Ambulatory Visit: Payer: 59 | Admitting: Family Medicine

## 2020-05-13 ENCOUNTER — Ambulatory Visit (HOSPITAL_COMMUNITY)
Admission: RE | Admit: 2020-05-13 | Discharge: 2020-05-13 | Disposition: A | Discharge status: Home / Self Care | Payer: 59 | Source: Ambulatory Visit | Attending: Family Medicine | Admitting: Family Medicine

## 2020-05-13 VITALS — BP 120/76 | HR 84 | Temp 97.0°F | Ht 66.0 in | Wt 156.6 lb

## 2020-05-13 DIAGNOSIS — M533 Sacrococcygeal disorders, not elsewhere classified: Secondary | ICD-10-CM | POA: Diagnosis present

## 2020-05-13 NOTE — Progress Notes (Signed)
Patient ID: Madison Aguilar, female    DOB: February 12, 1966, 55 y.o.   MRN: [id number]   Chief Complaint  Patient presents with  . place on tailbone for one month    Also noticed pain in right lung with inspiration yesterday but has resolved today   Subjective:  CC: tailbone pain  Presents today for an acute visit with a complaint of tailbone pain.  Reports that she had some pain upon inspiration yesterday, this resolved on its own has not reoccurred today.  Tailbone symptoms have been present for 2 months.  She denies injury, broken skin, lesions, erythema, or purulence.  Reports that it is painful when sitting.  She is due for her colonoscopy August 2022.  She has tried Advil for the pain with some relief.  Reports that she has seat warmers in her car this does not help the tailbone pain.  Denies injury/falls.    Medical History Madison Aguilar has a past medical history of Arthritis, Diverticulosis (09/2010), GERD (gastroesophageal reflux disease), History of anemia, History of colon polyps (09/2010), Hypothyroid, Low blood pressure, Migraine headache, Plantar fasciitis, Squamous cell carcinoma of skin (11/12/2015), Tinnitus, bilateral, and Varicose vein of leg.   Outpatient Encounter Medications as of 05/13/2020  Medication Sig  . levothyroxine (SYNTHROID) 75 MCG tablet TAKE 1 TABLET(75 MCG) BY MOUTH DAILY   No facility-administered encounter medications on file as of 05/13/2020.     Review of Systems  Constitutional: Negative for chills and fever.  Respiratory: Negative for shortness of breath.   Cardiovascular: Negative for chest pain.  Gastrointestinal: Positive for abdominal pain. Negative for constipation, diarrhea, nausea, rectal pain and vomiting.       Resolves on it own. Possibly related to eating and bowel movements.      Vitals BP 120/76   Pulse 84   Temp (!) 97 F (36.1 C) (Oral)   Ht 5\' 6"  (1.676 m)   Wt 156 lb 9.6 oz (71 kg)   SpO2 100%   BMI 25.28 kg/m   Objective:    Physical Exam Vitals reviewed.  Constitutional:      Appearance: Normal appearance.  Cardiovascular:     Rate and Rhythm: Normal rate and regular rhythm.     Heart sounds: Normal heart sounds.  Pulmonary:     Effort: Pulmonary effort is normal.     Breath sounds: Normal breath sounds.  Skin:    General: Skin is warm and dry.     Comments: Area above rectum without obvious lesion/ erythema/ drainage.   Neurological:     General: No focal deficit present.     Mental Status: She is alert.  Psychiatric:        Behavior: Behavior normal.      Assessment and Plan   1. Tail bone pain   No obvious deformity, erythema, or  purulence noted in area of complaint.  Will send for x-ray to rule out fracture/pathology.  Recommend ibuprofen with food for the next 2 weeks for pain and inflammation.  Due for routine colonoscopy August 2022, encouraged to stay on schedule with his exam.  She is not seen any blood in her stools.  Agrees with plan of care discussed today. Understands warning signs to seek further care: chest pain, shortness of breath, any significant change in health.  Understands to follow-up in 1 month, sooner if anything changes.  Will notify once results of x-ray are available.  If symptoms persist, will send to specialist.    Madison Ades,  NP 05/13/20

## 2020-05-22 ENCOUNTER — Other Ambulatory Visit: Payer: Self-pay

## 2020-05-22 DIAGNOSIS — R5383 Other fatigue: Secondary | ICD-10-CM

## 2020-05-22 DIAGNOSIS — E785 Hyperlipidemia, unspecified: Secondary | ICD-10-CM

## 2020-05-22 DIAGNOSIS — E039 Hypothyroidism, unspecified: Secondary | ICD-10-CM

## 2020-05-28 LAB — COMPREHENSIVE METABOLIC PANEL
ALT: 13 IU/L (ref 0–32)
AST: 17 IU/L (ref 0–40)
Albumin/Globulin Ratio: 1.7 (ref 1.2–2.2)
Albumin: 4.5 g/dL (ref 3.8–4.9)
Alkaline Phosphatase: 73 IU/L (ref 44–121)
BUN/Creatinine Ratio: 9 (ref 9–23)
BUN: 8 mg/dL (ref 6–24)
Bilirubin Total: 0.3 mg/dL (ref 0.0–1.2)
CO2: 26 mmol/L (ref 20–29)
Calcium: 9.6 mg/dL (ref 8.7–10.2)
Chloride: 103 mmol/L (ref 96–106)
Creatinine, Ser: 0.88 mg/dL (ref 0.57–1.00)
Globulin, Total: 2.6 g/dL (ref 1.5–4.5)
Glucose: 94 mg/dL (ref 65–99)
Potassium: 4.6 mmol/L (ref 3.5–5.2)
Sodium: 141 mmol/L (ref 134–144)
Total Protein: 7.1 g/dL (ref 6.0–8.5)
eGFR: 77 mL/min/{1.73_m2} (ref >59)

## 2020-05-28 LAB — CBC WITH DIFFERENTIAL/PLATELET
Basophils Absolute: 0.1 10*3/uL (ref 0.0–0.2)
Basos: 2 %
EOS (ABSOLUTE): 0.4 10*3/uL (ref 0.0–0.4)
Eos: 6 %
Hematocrit: 41.8 % (ref 34.0–46.6)
Hemoglobin: 14 g/dL (ref 11.1–15.9)
Immature Grans (Abs): 0 10*3/uL (ref 0.0–0.1)
Immature Granulocytes: 0 %
Lymphocytes Absolute: 2.1 10*3/uL (ref 0.7–3.1)
Lymphs: 30 %
MCH: 30.3 pg (ref 26.6–33.0)
MCHC: 33.5 g/dL (ref 31.5–35.7)
MCV: 91 fL (ref 79–97)
Monocytes Absolute: 0.5 10*3/uL (ref 0.1–0.9)
Monocytes: 7 %
Neutrophils Absolute: 4 10*3/uL (ref 1.4–7.0)
Neutrophils: 55 %
Platelets: 364 10*3/uL (ref 150–450)
RBC: 4.62 x10E6/uL (ref 3.77–5.28)
RDW: 12.6 % (ref 11.7–15.4)
WBC: 7.1 10*3/uL (ref 3.4–10.8)

## 2020-05-28 LAB — T4, FREE: Free T4: 1.39 ng/dL (ref 0.82–1.77)

## 2020-05-28 LAB — TSH: TSH: 5.6 u[IU]/mL — ABNORMAL HIGH (ref 0.450–4.500)

## 2020-05-30 ENCOUNTER — Other Ambulatory Visit: Payer: Self-pay | Admitting: Family Medicine

## 2020-05-30 DIAGNOSIS — E039 Hypothyroidism, unspecified: Secondary | ICD-10-CM

## 2020-05-30 MED ORDER — LEVOTHYROXINE SODIUM 75 MCG PO TABS
ORAL_TABLET | ORAL | 0 refills | Status: DC
Start: 2020-05-30 — End: 2020-08-02

## 2020-06-02 NOTE — Addendum Note (Signed)
Addended by: Dairl Ponder on: 06/02/2020 11:53 AM   Modules accepted: Orders

## 2020-06-05 ENCOUNTER — Encounter: Payer: Self-pay | Admitting: Family Medicine

## 2020-06-05 ENCOUNTER — Other Ambulatory Visit: Payer: Self-pay

## 2020-06-05 ENCOUNTER — Ambulatory Visit: Payer: 59 | Admitting: Family Medicine

## 2020-06-05 VITALS — BP 103/67 | HR 61 | Temp 97.4°F | Wt 158.2 lb

## 2020-06-05 DIAGNOSIS — M533 Sacrococcygeal disorders, not elsewhere classified: Secondary | ICD-10-CM | POA: Diagnosis not present

## 2020-06-05 DIAGNOSIS — E039 Hypothyroidism, unspecified: Secondary | ICD-10-CM

## 2020-06-05 NOTE — Progress Notes (Signed)
Patient ID: Madison Aguilar, female    DOB: [birth year], 55 y.o.   MRN: [id number]   Chief Complaint  Patient presents with  . Back Pain   Subjective:    HPI  Pt here for recheck on lower back pain. Pt saw Santiago Glad on 05/13/20 for tail bone pain. Pt states she had imaging but that was negative for acute findings. Pain comes and goes. Some day she can hardly get up. Feels more on the left side.   Discuss thyroid level. Read result note to patient. TSH ordered for 6 weeks.   Pain started months ago. In the tail bone, and to the left of the tail bone. Never really resolved. Pain does go away when taking ibuprofen.  But doesn't want to take much meds. Had xrays and negative.  Hard to sit, due to pain. 4-5 mo that has been going on. More on the left side of sacral.  No numbness or radiation of pain.  Saw gyn- and referred to pcp. Had lumbar MRI in 2020, no results in the chart.  Had a note from orthopedics will similar concern with hard to get up from sitting to standing position with pain on left side lower back.   Medical History Madison Aguilar has a past medical history of Arthritis, Diverticulosis (09/2010), GERD (gastroesophageal reflux disease), History of anemia, History of colon polyps (09/2010), Hypothyroid, Low blood pressure, Migraine headache, Plantar fasciitis, Squamous cell carcinoma of skin (11/12/2015), Tinnitus, bilateral, and Varicose vein of leg.   Outpatient Encounter Medications as of 06/05/2020  Medication Sig  . levothyroxine (SYNTHROID) 75 MCG tablet Take 2 tab p.o. on Sundays, then take 1 tab p.o. daily Mon-Saturday.   No facility-administered encounter medications on file as of 06/05/2020.     Review of Systems  Constitutional: Negative for chills and fever.  HENT: Negative for congestion, rhinorrhea and sore throat.   Respiratory: Negative for cough, shortness of breath and wheezing.   Cardiovascular: Negative for chest pain and leg swelling.  Gastrointestinal:  Negative for abdominal pain, diarrhea, nausea and vomiting.  Genitourinary: Negative for dysuria and frequency.  Musculoskeletal: Positive for back pain. Negative for arthralgias.  Skin: Negative for rash.  Neurological: Negative for dizziness, weakness and headaches.     Vitals BP 103/67   Pulse 61   Temp (!) 97.4 F (36.3 C)   Wt 158 lb 3.2 oz (71.8 kg)   SpO2 99%   BMI 25.53 kg/m   Objective:   Physical Exam Vitals and nursing note reviewed.  Constitutional:      General: She is not in acute distress.    Appearance: Normal appearance. She is normal weight. She is not ill-appearing.  Pulmonary:     Effort: No respiratory distress.  Musculoskeletal:        General: Tenderness present. No swelling, deformity or signs of injury. Normal range of motion.     Cervical back: Normal range of motion.     Right lower leg: No edema.     Left lower leg: No edema.     Comments: +ttp left of the mid sacral bone. No mass palpated. No erythema or rash.no pain with palpation of the lumbar spine. Normal rom of lumbar.   Skin:    General: Skin is warm and dry.     Findings: No rash.  Neurological:     General: No focal deficit present.     Mental Status: She is alert and oriented to person, place, and time.  Cranial Nerves: No cranial nerve deficit.     Sensory: No sensory deficit.     Motor: No weakness.     Gait: Gait normal.     Deep Tendon Reflexes: Reflexes normal.  Psychiatric:        Mood and Affect: Mood normal.        Behavior: Behavior normal.      Assessment and Plan   1. Sacral pain  2. Hypothyroidism, unspecified type   hypothyroidism tsh elevated, has been going back and forth for years btw 58mcg and 69mcg synthroid. So advised pt to take an extra 20mcg tab once a week. Get repeat tsh testing in 6 wks.  Cont to take thyroid medication as directed with extra tablet once per week.  Persistent sacral pain- F/u ortho for concern of tailbone pain that is  persisting. No sign of abscess or infection, no mass palpated. xrays reviewed, old ortho notes reviewed with similar complaint.  Advising f/u ortho and taking ibuprofen prn.  Return if symptoms worsen or fail to improve.

## 2020-07-31 ENCOUNTER — Ambulatory Visit: Payer: 59 | Admitting: Family Medicine

## 2020-07-31 ENCOUNTER — Other Ambulatory Visit: Payer: Self-pay

## 2020-07-31 VITALS — BP 115/73 | HR 53 | Temp 97.7°F | Ht 66.0 in | Wt 157.0 lb

## 2020-07-31 DIAGNOSIS — R001 Bradycardia, unspecified: Secondary | ICD-10-CM

## 2020-07-31 DIAGNOSIS — H66001 Acute suppurative otitis media without spontaneous rupture of ear drum, right ear: Secondary | ICD-10-CM

## 2020-07-31 DIAGNOSIS — R42 Dizziness and giddiness: Secondary | ICD-10-CM

## 2020-07-31 DIAGNOSIS — R079 Chest pain, unspecified: Secondary | ICD-10-CM

## 2020-07-31 DIAGNOSIS — E039 Hypothyroidism, unspecified: Secondary | ICD-10-CM | POA: Diagnosis not present

## 2020-07-31 DIAGNOSIS — R0789 Other chest pain: Secondary | ICD-10-CM

## 2020-07-31 MED ORDER — FLUCONAZOLE 150 MG PO TABS: 150.0000 mg | ORAL_TABLET | Freq: Once | ORAL | 1 refills | Status: AC

## 2020-07-31 MED ORDER — AMOXICILLIN-POT CLAVULANATE 875-125 MG PO TABS: 1.0000 | ORAL_TABLET | Freq: Two times a day (BID) | ORAL | 0 refills | Status: DC

## 2020-07-31 NOTE — Progress Notes (Signed)
ekg  Patient ID: Madison Aguilar, female    DOB: 09/15/1965, 55 y.o.   MRN: [id number]   Chief Complaint  Patient presents with   Dizziness   Subjective:    HPI dizziness for about one week. Checked bp at [employer] and it was 117/85. Pt Checked glucose and it was 97. Took 2 covid test that was negative because she feels tired. Chest pain started today. Feels like  is going to pass out at times.   Sneezing in mornings, but has this for months, not new.  Once she does her sneezing then is fine rest of the day.  1 wk ago and getting dizzy spells and scared to drive long distances.  Noticing more dizziness in am and today with sitting at work.  Noticing elevated diastolic bp. Then thought having chest pressure in chest, lasting about 10-15 mins and subsided.  While getting dressed this am.   Slight headache, feels she has a sinus infection going on.  Driving noticed this dizzy feeling and pulled over due to feeling in her head.   Noticed taking 2 levothyroxine on sundays then once daily.  Increased this on last visit in 4/22.   Normal Hr ran in upper 50- low 60s. Today is 47-53, bpm.  Medical History Jorryn has a past medical history of Arthritis, Diverticulosis (09/2010), GERD (gastroesophageal reflux disease), History of anemia, History of colon polyps (09/2010), Hypothyroid, Low blood pressure, Migraine headache, Plantar fasciitis, Squamous cell carcinoma of skin (11/12/2015), Tinnitus, bilateral, and Varicose vein of leg.   Outpatient Encounter Medications as of 07/31/2020  Medication Sig   amoxicillin-clavulanate (AUGMENTIN) 875-125 MG tablet Take 1 tablet by mouth 2 (two) times daily.   fluconazole (DIFLUCAN) 150 MG tablet Take 1 tablet (150 mg total) by mouth once for 1 dose.   levothyroxine (SYNTHROID) 75 MCG tablet Take 2 tab p.o. on Sundays, then take 1 tab p.o. daily Mon-Saturday.   No facility-administered encounter medications on file as of 07/31/2020.     Review of Systems   Constitutional:  Negative for chills and fever.  HENT:  Positive for tinnitus. Negative for congestion, ear pain, rhinorrhea, sinus pressure, sinus pain and sore throat.   Eyes:  Negative for pain, discharge and itching.  Respiratory:  Negative for cough.   Cardiovascular:  Positive for chest pain.  Gastrointestinal:  Negative for constipation, diarrhea, nausea and vomiting.  Neurological:  Positive for dizziness and headaches.    Vitals BP 115/73   Pulse (!) 53   Temp 97.7 F (36.5 C)   Ht 5\' 6"  (1.676 m)   Wt 157 lb (71.2 kg)   SpO2 100%   BMI 25.34 kg/m   Objective:   Physical Exam Vitals and nursing note reviewed.  Constitutional:      General: She is not in acute distress.    Appearance: Normal appearance. She is not ill-appearing.  HENT:     Head: Normocephalic and atraumatic.     Right Ear: Ear canal and external ear normal.     Left Ear: Tympanic membrane, ear canal and external ear normal.     Ears:     Comments: +serous effusion on rt ear    Nose: Nose normal. No congestion or rhinorrhea.     Mouth/Throat:     Mouth: Mucous membranes are moist.     Pharynx: Oropharynx is clear.  Eyes:     Extraocular Movements: Extraocular movements intact.     Conjunctiva/sclera: Conjunctivae normal.     Pupils:  Pupils are equal, round, and reactive to light.  Cardiovascular:     Rate and Rhythm: Normal rate and regular rhythm.     Pulses: Normal pulses.     Heart sounds: Normal heart sounds.  Pulmonary:     Effort: Pulmonary effort is normal.     Breath sounds: Normal breath sounds. No wheezing, rhonchi or rales.  Musculoskeletal:        General: Normal range of motion.     Right lower leg: No edema.     Left lower leg: No edema.  Skin:    General: Skin is warm and dry.     Findings: No lesion or rash.  Neurological:     General: No focal deficit present.     Mental Status: She is alert and oriented to person, place, and time.     Cranial Nerves: No cranial  nerve deficit.     Motor: No weakness.     Gait: Gait normal.  Psychiatric:        Mood and Affect: Mood normal.        Behavior: Behavior normal.     Assessment and Plan   1. Dizziness  2. Chest pressure - EKG 12-Lead - CBC With Differential - Ambulatory referral to Cardiology  3. Hypothyroidism, unspecified type - TSH - T3, free - T4, free  4. Bradycardia - CBC With Differential - Ambulatory referral to Cardiology  5. Non-recurrent acute suppurative otitis media of right ear without spontaneous rupture of tympanic membrane - amoxicillin-clavulanate (AUGMENTIN) 875-125 MG tablet; Take 1 tablet by mouth 2 (two) times daily.  Dispense: 20 tablet; Refill: 0   Dizziness, chest pressure, and bradycardia- Ekg-  showing bradycardia- at 47bpm.  No st elevation or ischemic changes.  -Referral to cardiology. Has had bradycardia since 2019.  Thyroid- increased levothyroxine on last visit in 4/22. Pt to get labs to recheck thyroid and blood count.  Dizziness- may be multifactorial.  Will treat for otitis media on rt and gave augmentin, flonase and claritin-d.  Tylenol or headache prn. Call or rto if not improving.  Pt declining meclizine.   Return if symptoms worsen or fail to improve.  07/31/2020

## 2020-08-01 ENCOUNTER — Other Ambulatory Visit: Payer: Self-pay | Admitting: Family Medicine

## 2020-08-01 DIAGNOSIS — E039 Hypothyroidism, unspecified: Secondary | ICD-10-CM

## 2020-08-02 NOTE — Telephone Encounter (Signed)
Remind pt to get labs to check tsh on new dosing.  Dr. Lovena Le

## 2020-08-04 NOTE — Telephone Encounter (Signed)
MyChart message sent to patient.

## 2020-08-14 LAB — CBC WITH DIFFERENTIAL
Basophils Absolute: 0.1 10*3/uL (ref 0.0–0.2)
Basos: 1 %
EOS (ABSOLUTE): 0.4 10*3/uL (ref 0.0–0.4)
Eos: 4 %
Hematocrit: 42.5 % (ref 34.0–46.6)
Hemoglobin: 13.7 g/dL (ref 11.1–15.9)
Immature Grans (Abs): 0 10*3/uL (ref 0.0–0.1)
Immature Granulocytes: 0 %
Lymphocytes Absolute: 2.2 10*3/uL (ref 0.7–3.1)
Lymphs: 25 %
MCH: 30.1 pg (ref 26.6–33.0)
MCHC: 32.2 g/dL (ref 31.5–35.7)
MCV: 93 fL (ref 79–97)
Monocytes Absolute: 0.6 10*3/uL (ref 0.1–0.9)
Monocytes: 7 %
Neutrophils Absolute: 5.4 10*3/uL (ref 1.4–7.0)
Neutrophils: 63 %
RBC: 4.55 x10E6/uL (ref 3.77–5.28)
RDW: 12.4 % (ref 11.7–15.4)
WBC: 8.7 10*3/uL (ref 3.4–10.8)

## 2020-08-14 LAB — T4, FREE: Free T4: 1.68 ng/dL (ref 0.82–1.77)

## 2020-08-14 LAB — TSH: TSH: 0.91 u[IU]/mL (ref 0.450–4.500)

## 2020-08-14 LAB — T3, FREE: T3, Free: 3 pg/mL (ref 2.0–4.4)

## 2020-08-29 ENCOUNTER — Other Ambulatory Visit: Payer: Self-pay | Admitting: Family Medicine

## 2020-08-29 DIAGNOSIS — E039 Hypothyroidism, unspecified: Secondary | ICD-10-CM

## 2020-09-03 NOTE — Progress Notes (Signed)
CARDIOLOGY CONSULT NOTE       Patient ID: Madison Aguilar MRN: [id number] DOB/AGE: 57/09/1965 57 y.o.  Admit date: (Not on file) Referring Physician: Lovena Le Primary Physician: Erven Colla, DO Primary Cardiologist: New Reason for Consultation: Chest pain  Active Problems:   * No active hospital problems. *   HPI:  55 y.o. referred by Dr Lovena Le for chest pain. Reviewed office note from 07/31/20 Patient has been having fatigue and dizziness Having some resting chest pain Headaches ? 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 Married with one daughter who use to teach at [employer] other systems reviewed and negative except as noted above  Past Medical History:  Diagnosis Date   Arthritis    Right hip   Diverticulosis 09/2010   Left side, noted on colonoscopy   GERD (gastroesophageal reflux disease)    History of anemia    History of colon polyps 09/2010   Hypothyroid    Low blood pressure    Migraine headache    Plantar fasciitis    Squamous cell carcinoma of skin 11/12/2015   KA on right outer thigh   Tinnitus, bilateral    Varicose vein of leg     Family History  Problem Relation Age of Onset   Diverticulitis Mother        ileostomy   Diverticulitis Maternal Grandmother    Colon cancer Neg Hx    Liver disease Neg Hx    Crohn's disease Neg Hx    Ulcerative colitis Neg Hx     Social History   Socioeconomic History   Marital status: Married    Spouse name: Not on file   Number of children: 1   Years of education: Not on file   Highest education level: Not on file  Occupational History   Occupation:  [employer]    Comment: Freight forwarder  Tobacco Use   Smoking status: Light Smoker    Packs/day: 0.25    Years: 20.00    Pack years: 5.00    Types: Cigarettes   Smokeless tobacco: Never  Vaping Use   Vaping Use: Never used  Substance and Sexual Activity   Alcohol use: Yes    Comment: a glass of wine   Drug use: No   Sexual activity: Not on file  Other Topics Concern   Not on file  Social History Narrative   Not on file   Social Determinants of Health   Financial Resource Strain: Not on file  Food Insecurity: Not on file  Transportation Needs: Not on file  Physical Activity: Not on file  Stress: Not on file  Social Connections: Not on file  Intimate Partner Violence: Not on file    Past Surgical History:  Procedure Laterality Date   Bilateral great toe surgery     COLONOSCOPY W/ POLYPECTOMY  2012   DILITATION & CURRETTAGE/HYSTROSCOPY WITH NOVASURE ABLATION  03/03/2012   Procedure: DILATATION & CURETTAGE/HYSTEROSCOPY WITH NOVASURE ABLATION;  Surgeon: Sharene Butters, MD;  Location: [facility];  Service: Gynecology;  Laterality: N/A;   PLANTAR FASCIA RELEASE  TOTAL HIP ARTHROPLASTY Right 02/03/2018   Procedure: TOTAL HIP ARTHROPLASTY;  Surgeon: Earlie Server, MD;  Location: [facility];  Service: Orthopedics;  Laterality: Right;      Current Outpatient Medications:    levothyroxine (SYNTHROID) 75 MCG tablet, TAKE 2 TABLETS BY MOUTH ON SUNDAYS THEN TAKE 1 TABLET BY MOUTH DAILY MONDAY THRU SATURDAY, Disp: 90 tablet, Rfl: 1    Physical Exam: Blood pressure 110/62, pulse 62, height 5\' 6"  (1.676 m), weight 72.8 kg, SpO2 98 %.    Affect appropriate Healthy:  appears stated age 55: normal Neck supple with no adenopathy JVP normal no bruits no thyromegaly Lungs clear with no wheezing and good diaphragmatic motion Heart:  S1/S2 no murmur, no rub, gallop or click PMI normal Abdomen: benighn, BS positve, no tenderness, no AAA no bruit.  No HSM or HJR Distal pulses intact  with no bruits No edema Neuro non-focal Skin warm and dry No muscular weakness   Labs:   Lab Results  Component Value Date   WBC 8.7 08/13/2020   HGB 13.7 08/13/2020   HCT 42.5 08/13/2020   MCV 93 08/13/2020   PLT 364 05/27/2020   No results for input(s): NA, K, CL, CO2, BUN, CREATININE, CALCIUM, PROT, BILITOT, ALKPHOS, ALT, AST, GLUCOSE in the last 168 hours.  Invalid input(s): LABALBU No results found for: CKTOTAL, CKMB, CKMBINDEX, TROPONINI  Lab Results  Component Value Date   CHOL 216 (H) 07/30/2019   CHOL 230 (H) 09/25/2018   CHOL 223 (H) 09/13/2017   Lab Results  Component Value Date   HDL 64 07/30/2019   HDL 70 09/25/2018   HDL 68 09/13/2017   Lab Results  Component Value Date   LDLCALC 136 (H) 07/30/2019   LDLCALC 147 (H) 09/25/2018   LDLCALC 142 (H) 09/13/2017   Lab Results  Component Value Date   TRIG 92 07/30/2019   TRIG 65 09/25/2018   TRIG 66 09/13/2017   Lab Results  Component Value Date   CHOLHDL 3.4 07/30/2019   CHOLHDL 3.3 09/25/2018   CHOLHDL 3.3 09/13/2017   No results found for: LDLDIRECT    Radiology: No results found.  EKG: SR rate 47 normal    ASSESSMENT AND PLAN:   Dizziness:  etiology ot clear but not likely cardiac in nature Primary should consider f/u head CT will order  Echo to r/o structural heart issue  Bradycardia:  No AV block ETT to r/o impaired chronotropic response and normal hemodynamic resoponse Given dizziness  Hypothyroid :  continue replacement TSH 5.6 normal free T4  CAD: risk family history discussed coronary calcium score   ETT Calcium score Echo   F/U PRN if testing normal  Signed: Jenkins Rouge 09/11/2020, 3:03 PM

## 2020-09-11 ENCOUNTER — Other Ambulatory Visit: Payer: Self-pay

## 2020-09-11 ENCOUNTER — Ambulatory Visit: Payer: 59 | Admitting: Cardiovascular Disease

## 2020-09-11 ENCOUNTER — Encounter: Payer: Self-pay | Admitting: Cardiovascular Disease

## 2020-09-11 VITALS — BP 110/62 | HR 62 | Ht 66.0 in | Wt 160.6 lb

## 2020-09-11 DIAGNOSIS — R001 Bradycardia, unspecified: Secondary | ICD-10-CM

## 2020-09-11 DIAGNOSIS — R002 Palpitations: Secondary | ICD-10-CM

## 2020-09-11 DIAGNOSIS — R42 Dizziness and giddiness: Secondary | ICD-10-CM

## 2020-09-11 DIAGNOSIS — R079 Chest pain, unspecified: Secondary | ICD-10-CM

## 2020-09-11 DIAGNOSIS — E039 Hypothyroidism, unspecified: Secondary | ICD-10-CM

## 2020-09-11 NOTE — Patient Instructions (Signed)
Medication Instructions:  Your physician recommends that you continue on your current medications as directed. Please refer to the Current Medication list given to you today.  *If you need a refill on your cardiac medications before your next appointment, please call your pharmacy*   Lab Work: NONE   If you have labs (blood work) drawn today and your tests are completely normal, you will receive your results only by: [place] (if you have MyChart) OR A paper copy in the mail If you have any lab test that is abnormal or we need to change your treatment, we will call you to review the results.   Testing/Procedures: Your physician has requested that you have an echocardiogram. Echocardiography is a painless test that uses sound waves to create images of your heart. It provides your doctor with information about the size and shape of your heart and how well your heart's chambers and valves are working. This procedure takes approximately one hour. There are no restrictions for this procedure.  Your physician has requested that you have an exercise tolerance test. For further information please visit [email or web address]. Please also follow instruction sheet, as given.  Calcium Score CT    Follow-Up: At [facility], you and your health needs are our priority.  As part of our continuing mission to provide you with exceptional heart care, we have created designated Provider Care Teams.  These Care Teams include your primary Cardiologist (physician) and Advanced Practice Providers (APPs -  Physician Assistants and Nurse Practitioners) who all work together to provide you with the care you need, when you need it.  We recommend signing up for the patient portal called "MyChart".  Sign up information is provided on this After Visit Summary.  MyChart is used to connect with patients for Virtual Visits (Telemedicine).  Patients are able to view lab/test results, encounter notes, upcoming  appointments, etc.  Non-urgent messages can be sent to your provider as well.   To learn more about what you can do with MyChart, go to [email or web address].    Your next appointment:    As Needed   The format for your next appointment:   In Person  Provider:   Jenkins Rouge, MD   Other Instructions Thank you for choosing [facility]!

## 2020-09-30 ENCOUNTER — Encounter: Payer: Self-pay | Admitting: *Deleted

## 2020-10-08 ENCOUNTER — Encounter (HOSPITAL_COMMUNITY): Payer: 59

## 2020-10-08 ENCOUNTER — Other Ambulatory Visit (HOSPITAL_COMMUNITY): Payer: 59

## 2020-10-10 ENCOUNTER — Ambulatory Visit (HOSPITAL_COMMUNITY): Admission: RE | Admit: 2020-10-10 | Payer: 59 | Source: Ambulatory Visit

## 2020-10-13 ENCOUNTER — Other Ambulatory Visit: Payer: Self-pay

## 2020-10-13 ENCOUNTER — Ambulatory Visit (HOSPITAL_COMMUNITY)
Admission: RE | Admit: 2020-10-13 | Discharge: 2020-10-13 | Disposition: A | Discharge status: Home / Self Care | Payer: 59 | Source: Ambulatory Visit | Attending: Cardiovascular Disease | Admitting: Cardiovascular Disease

## 2020-10-13 ENCOUNTER — Ambulatory Visit (HOSPITAL_COMMUNITY): Payer: 59

## 2020-10-13 ENCOUNTER — Ambulatory Visit (HOSPITAL_BASED_OUTPATIENT_CLINIC_OR_DEPARTMENT_OTHER)
Admission: RE | Admit: 2020-10-13 | Discharge: 2020-10-13 | Disposition: A | Discharge status: Home / Self Care | Payer: 59 | Source: Ambulatory Visit | Attending: Cardiovascular Disease | Admitting: Cardiovascular Disease

## 2020-10-13 DIAGNOSIS — R079 Chest pain, unspecified: Secondary | ICD-10-CM

## 2020-10-13 LAB — EXERCISE TOLERANCE TEST
Angina Index: 0
Base ST Depression (mm): 0 mm
Estimated workload: 11
Exercise duration (min): 9 min
Exercise duration (sec): 18 s
MPHR: 163 {beats}/min
Peak HR: 144 {beats}/min
Percent HR: 88.3 %
RPE: 13
Rest HR: 65 {beats}/min

## 2020-10-13 LAB — ECHOCARDIOGRAM COMPLETE
AR max vel: 2.64 cm2
AV Area VTI: 2.44 cm2
AV Area mean vel: 2.28 cm2
AV Mean grad: 4 mmHg
AV Peak grad: 7.6 mmHg
Ao pk vel: 1.38 m/s
Area-P 1/2: 3.95 cm2
MV VTI: 2.95 cm2
S' Lateral: 2.88 cm

## 2020-10-13 NOTE — Progress Notes (Signed)
*  PRELIMINARY RESULTS* Echocardiogram 2D Echocardiogram has been performed.  Elpidio Anis 10/13/2020, 11:46 AM

## 2020-12-02 ENCOUNTER — Other Ambulatory Visit: Payer: Self-pay | Admitting: Family Medicine

## 2020-12-02 DIAGNOSIS — E039 Hypothyroidism, unspecified: Secondary | ICD-10-CM

## 2020-12-25 ENCOUNTER — Telehealth: Payer: 59 | Admitting: Physician Assistant

## 2020-12-25 DIAGNOSIS — J208 Acute bronchitis due to other specified organisms: Secondary | ICD-10-CM

## 2020-12-25 DIAGNOSIS — B9689 Other specified bacterial agents as the cause of diseases classified elsewhere: Secondary | ICD-10-CM | POA: Diagnosis not present

## 2020-12-25 DIAGNOSIS — Z91199 Patient's noncompliance with other medical treatment and regimen due to unspecified reason: Secondary | ICD-10-CM

## 2020-12-25 MED ORDER — PREDNISONE 20 MG PO TABS: 40.0000 mg | ORAL_TABLET | Freq: Every day | ORAL | 0 refills | Status: DC

## 2020-12-25 MED ORDER — ALBUTEROL SULFATE HFA 108 (90 BASE) MCG/ACT IN AERS: 2.0000 | INHALATION_SPRAY | Freq: Four times a day (QID) | RESPIRATORY_TRACT | 0 refills | Status: DC | PRN

## 2020-12-25 MED ORDER — BENZONATATE 100 MG PO CAPS: 100.0000 mg | ORAL_CAPSULE | Freq: Three times a day (TID) | ORAL | 0 refills | Status: DC | PRN

## 2020-12-25 MED ORDER — AZITHROMYCIN 250 MG PO TABS: ORAL_TABLET | ORAL | 0 refills | Status: DC

## 2020-12-25 NOTE — Progress Notes (Signed)
Virtual Visit Consent   Madison Aguilar, you are scheduled for a virtual visit with a [facility] provider today.     Just as with appointments in the office, your consent must be obtained to participate.  Your consent will be active for this visit and any virtual visit you may have with one of our providers in the next 365 days.     If you have a MyChart account, a copy of this consent can be sent to you electronically.  All virtual visits are billed to your insurance company just like a traditional visit in the office.    As this is a virtual visit, video technology does not allow for your provider to perform a traditional examination.  This may limit your provider's ability to fully assess your condition.  If your provider identifies any concerns that need to be evaluated in person or the need to arrange testing (such as labs, EKG, etc.), we will make arrangements to do so.     Although advances in technology are sophisticated, we cannot ensure that it will always work on either your end or our end.  If the connection with a video visit is poor, the visit may have to be switched to a telephone visit.  With either a video or telephone visit, we are not always able to ensure that we have a secure connection.     I need to obtain your verbal consent now.   Are you willing to proceed with your visit today?    Madison Aguilar has provided verbal consent on 12/25/2020 for a virtual visit (video or telephone).   Mar Daring, PA-C   Date: 12/25/2020 11:11 AM   Virtual Visit via Video Note   I, Mar Daring, connected with  Madison Aguilar  ([id number], 27-Oct-1965) on 12/25/20 at 11:00 AM EDT by a video-enabled telemedicine application and verified that I am speaking with the correct person using two identifiers.  Location: Patient: Virtual Visit Location Patient: Home Provider: Virtual Visit Location Provider: Home Office   I discussed the limitations of evaluation and management by  telemedicine and the availability of in person appointments. The patient expressed understanding and agreed to proceed.    History of Present Illness: Madison Aguilar is a 55 y.o. who identifies as a female who was assigned female at birth, and is being seen today for flu like symptoms and chest congestion. Symptoms started about 3 days ago. Has had fever, chills, body aches, cough, congestion, nasal congestion, rhinorrhea, headache, vomiting (phlegm), lack of appetite, wheezing, shortness of breath. Covid testing is negative.   Problems:  Patient Active Problem List   Diagnosis Date Noted   Tail bone pain 05/13/2020   Exposure to confirmed case of COVID-19 02/12/2020   Primary localized osteoarthritis of right hip 01/24/2018   Plantar fasciitis 10/02/2012   Hypothyroidism 10/02/2012   Left lower quadrant pain 09/22/2010   Change in bowel function 09/22/2010   Rectal bleeding 09/22/2010   GERD (gastroesophageal reflux disease) 09/22/2010   Esophageal dysphagia 09/22/2010    Allergies: No Known Allergies Medications:  Current Outpatient Medications:    albuterol (VENTOLIN HFA) 108 (90 Base) MCG/ACT inhaler, Inhale 2 puffs into the lungs every 6 (six) hours as needed for wheezing or shortness of breath., Disp: 8 g, Rfl: 0   azithromycin (ZITHROMAX) 250 MG tablet, Take 2 tablets PO on day one, and one tablet PO daily thereafter until completed., Disp: 6 tablet, Rfl: 0  benzonatate (TESSALON) 100 MG capsule, Take 1 capsule (100 mg total) by mouth 3 (three) times daily as needed., Disp: 30 capsule, Rfl: 0   predniSONE (DELTASONE) 20 MG tablet, Take 2 tablets (40 mg total) by mouth daily with breakfast., Disp: 14 tablet, Rfl: 0   levothyroxine (SYNTHROID) 75 MCG tablet, TAKE 2 TABLETS BY MOUTH ON SUNDAYS, THEN TAKE 1 TABLET MONDAY THROUGH SATURDAY, Disp: 90 tablet, Rfl: 1  Observations/Objective: Patient is well-developed, well-nourished in no acute distress.  Resting comfortably at home.   Head is normocephalic, atraumatic.  No labored breathing.  Speech is clear and coherent with logical content.  Patient is alert and oriented at baseline.  Dry, barking cough.  Assessment and Plan: 1. Acute bacterial bronchitis - predniSONE (DELTASONE) 20 MG tablet; Take 2 tablets (40 mg total) by mouth daily with breakfast.  Dispense: 14 tablet; Refill: 0 - azithromycin (ZITHROMAX) 250 MG tablet; Take 2 tablets PO on day one, and one tablet PO daily thereafter until completed.  Dispense: 6 tablet; Refill: 0 - albuterol (VENTOLIN HFA) 108 (90 Base) MCG/ACT inhaler; Inhale 2 puffs into the lungs every 6 (six) hours as needed for wheezing or shortness of breath.  Dispense: 8 g; Refill: 0 - benzonatate (TESSALON) 100 MG capsule; Take 1 capsule (100 mg total) by mouth 3 (three) times daily as needed.  Dispense: 30 capsule; Refill: 0  - Worsening despite OTC medications - Symptoms seem consistent with flu that has transitioned to Bronchitis - Will treat with zpak, prednisone, albuterol and tessalon perles.  - Push fluids.  - Rest.  - Call or seek in person evaluation if worsening.    Follow Up Instructions: I discussed the assessment and treatment plan with the patient. The patient was provided an opportunity to ask questions and all were answered. The patient agreed with the plan and demonstrated an understanding of the instructions.  A copy of instructions were sent to the patient via MyChart unless otherwise noted below.    The patient was advised to call back or seek an in-person evaluation if the symptoms worsen or if the condition fails to improve as anticipated.  Time:  I spent 15 minutes with the patient via telehealth technology discussing the above problems/concerns.    Mar Daring, PA-C

## 2020-12-25 NOTE — Progress Notes (Signed)
The patient no-showed for appointment despite this provider sending direct link x 2 with no response and waiting for at least 10 minutes from appointment time for patient to join. They will be marked as a NS for this appointment/time.   Shine Mikes M Sayre Witherington, PA-C    

## 2020-12-25 NOTE — Patient Instructions (Signed)
Jodie Echevaria, thank you for joining Mar Daring, PA-C for today's virtual visit.  While this provider is not your primary care provider (PCP), if your PCP is located in our provider database this encounter information will be shared with them immediately following your visit.  Consent: (Patient) Janequa Kipnis Holroyd provided verbal consent for this virtual visit at the beginning of the encounter.  Current Medications:  Current Outpatient Medications:    albuterol (VENTOLIN HFA) 108 (90 Base) MCG/ACT inhaler, Inhale 2 puffs into the lungs every 6 (six) hours as needed for wheezing or shortness of breath., Disp: 8 g, Rfl: 0   azithromycin (ZITHROMAX) 250 MG tablet, Take 2 tablets PO on day one, and one tablet PO daily thereafter until completed., Disp: 6 tablet, Rfl: 0   benzonatate (TESSALON) 100 MG capsule, Take 1 capsule (100 mg total) by mouth 3 (three) times daily as needed., Disp: 30 capsule, Rfl: 0   predniSONE (DELTASONE) 20 MG tablet, Take 2 tablets (40 mg total) by mouth daily with breakfast., Disp: 14 tablet, Rfl: 0   levothyroxine (SYNTHROID) 75 MCG tablet, TAKE 2 TABLETS BY MOUTH ON SUNDAYS, THEN TAKE 1 TABLET MONDAY THROUGH SATURDAY, Disp: 90 tablet, Rfl: 1   Medications ordered in this encounter:  Meds ordered this encounter  Medications   predniSONE (DELTASONE) 20 MG tablet    Sig: Take 2 tablets (40 mg total) by mouth daily with breakfast.    Dispense:  14 tablet    Refill:  0    Order Specific Question:   Supervising Provider    Answer:   Sabra Heck, BRIAN [[id number]]   azithromycin (ZITHROMAX) 250 MG tablet    Sig: Take 2 tablets PO on day one, and one tablet PO daily thereafter until completed.    Dispense:  6 tablet    Refill:  0    Order Specific Question:   Supervising Provider    Answer:   MILLER, BRIAN [[id number]]   albuterol (VENTOLIN HFA) 108 (90 Base) MCG/ACT inhaler    Sig: Inhale 2 puffs into the lungs every 6 (six) hours as needed for wheezing or shortness of  breath.    Dispense:  8 g    Refill:  0    Order Specific Question:   Supervising Provider    Answer:   MILLER, BRIAN [[id number]]   benzonatate (TESSALON) 100 MG capsule    Sig: Take 1 capsule (100 mg total) by mouth 3 (three) times daily as needed.    Dispense:  30 capsule    Refill:  0    Order Specific Question:   Supervising Provider    Answer:   Sabra Heck, [place]     *If you need refills on other medications prior to your next appointment, please contact your pharmacy*  Follow-Up: Call back or seek an in-person evaluation if the symptoms worsen or if the condition fails to improve as anticipated.  Other Instructions Acute Bronchitis, Adult Acute bronchitis is sudden or acute swelling of the air tubes (bronchi) in the lungs. Acute bronchitis causes these tubes to fill with mucus, which can make it hard to breathe. It can also cause coughing or wheezing. In adults, acute bronchitis usually goes away within 2 weeks. A cough caused by bronchitis may last up to 3 weeks. Smoking, allergies, and asthma can make the condition worse. What are the causes? This condition can be caused by germs and by substances that irritate the lungs, including: Cold and flu viruses. The most common cause  of this condition is the virus that causes the common cold. Bacteria. Substances that irritate the lungs, including: Smoke from cigarettes and other forms of tobacco. Dust and pollen. Fumes from chemical products, gases, or burned fuel. Other materials that pollute indoor or outdoor air. Close contact with someone who has acute bronchitis. What increases the risk? The following factors may make you more likely to develop this condition: A weak body's defense system, also called the immune system. A condition that affects your lungs and breathing, such as asthma. What are the signs or symptoms? Common symptoms of this condition include: Lung and breathing problems, such as: Coughing. This may bring up  clear, yellow, or green mucus from your lungs (sputum). Wheezing. Having too much mucus in your lungs (chest congestion). Having shortness of breath. A fever. Chills. Aches and pains, including: Tightness in your chest and other body aches. A sore throat. How is this diagnosed? This condition is usually diagnosed based on: Your symptoms and medical history. A physical exam. You may also have other tests, including tests to rule out other conditions, such as pneumonia. These tests include: A test of lung function. Test of a mucus sample to look for the presence of bacteria. Tests to check the oxygen level in your blood. Blood tests. Chest X-ray. How is this treated? Most cases of acute bronchitis clear up over time without treatment. Your health care provider may recommend: Drinking more fluids. This can thin your mucus, which may improve your breathing. Using a device that gets medicine into your lungs (inhaler) to help improve breathing and control coughing. Using a vaporizer or a humidifier. These are machines that add water to the air to help you breathe better. Taking a medicine for a fever. Taking a medicine that thins mucus and clears congestion (expectorant). Taking a medicine that prevents or stops coughing (cough suppressant). Follow these instructions at home: Activity Get plenty of rest. Return to your normal activities as told by your health care provider. Ask your health care provider what activities are safe for you. Lifestyle  Drink enough fluid to keep your urine pale yellow. Do not drink alcohol. Do not use any products that contain nicotine or tobacco, such as cigarettes, e-cigarettes, and chewing tobacco. If you need help quitting, ask your health care provider. Be aware that: Your bronchitis will get worse if you smoke or breathe in other people's smoke (secondhand smoke). Your lungs will heal faster if you quit smoking. General instructions Take  over-the-counter and prescription medicines only as told by your health care provider. Use an inhaler, vaporizer, or humidifier as told by your health care provider. If you have a sore throat, gargle with a salt-water mixture 3-4 times a day or as needed. To make a salt-water mixture, completely dissolve -1 tsp (3-6 g) of salt in 1 cup (237 mL) of warm water. Take two teaspoons of honey at bedtime to lessen coughing at night. Keep all follow-up visits as told by your health care provider. This is important. How is this prevented? To lower your risk of getting this condition again: Wash your hands often with soap and water. If soap and water are not available, use hand sanitizer. Avoid contact with people who have cold symptoms. Try not to touch your mouth, nose, or eyes with your hands. Avoid places where there are fumes from chemicals. Breathing these fumes will make your condition worse. Get the flu shot every year. Contact a health care provider if: Your symptoms do not  improve after 2 weeks of treatment. You vomit more than once or twice. You have symptoms of dehydration such as: Dark urine. Dry skin or eyes. Increased thirst. Headaches. Confusion. Muscle cramps. Get help right away if you: Cough up blood. Feel pain in your chest. Have severe shortness of breath. Faint or keep feeling like you are going to faint. Have a severe headache. Have fever or chills that get worse. These symptoms may represent a serious problem that is an emergency. Do not wait to see if the symptoms will go away. Get medical help right away. Call your local emergency services (911 in the [place] Do not drive yourself to the hospital. Summary Acute bronchitis is sudden (acute) inflammation of the air tubes (bronchi) between the windpipe and the lungs. In adults, acute bronchitis usually goes away within 2 weeks, although coughing may last 3 weeks or longer. Take over-the-counter and prescription medicines  only as told by your health care provider. Drink enough fluid to keep your urine pale yellow. Contact a health care provider if your symptoms do not improve after 2 weeks of treatment. Get help right away if you cough up blood, faint, or have chest pain or shortness of breath. This information is not intended to replace advice given to you by your health care provider. Make sure you discuss any questions you have with your health care provider. Document Revised: 01/09/2020 Document Reviewed: 09/01/2018 Elsevier Patient Education  2022 [employer].    If you have been instructed to have an in-person evaluation today at a local Urgent Care facility, please use the link below. It will take you to a list of all of our available [facility], including address, phone number and hours of operation. Please do not delay care.  [facility]  If you or a family member do not have a primary care provider, use the link below to schedule a visit and establish care. When you choose a [facility] primary care physician or advanced practice provider, you gain a long-term partner in health. Find a Primary Care Provider  Learn more about [facility]'s in-office and virtual care options: [facility]

## 2021-03-02 ENCOUNTER — Other Ambulatory Visit: Payer: Self-pay

## 2021-03-02 DIAGNOSIS — E039 Hypothyroidism, unspecified: Secondary | ICD-10-CM

## 2021-03-02 MED ORDER — LEVOTHYROXINE SODIUM 75 MCG PO TABS
ORAL_TABLET | ORAL | 0 refills | Status: DC
Start: 2021-03-02 — End: 2021-05-25

## 2021-03-04 ENCOUNTER — Other Ambulatory Visit: Payer: Self-pay

## 2021-03-04 ENCOUNTER — Ambulatory Visit: Payer: 59 | Admitting: Family Medicine

## 2021-03-04 VITALS — BP 107/65 | HR 84 | Ht 66.0 in | Wt 163.0 lb

## 2021-03-04 DIAGNOSIS — R5383 Other fatigue: Secondary | ICD-10-CM | POA: Diagnosis not present

## 2021-03-04 DIAGNOSIS — E039 Hypothyroidism, unspecified: Secondary | ICD-10-CM | POA: Diagnosis not present

## 2021-03-04 DIAGNOSIS — E785 Hyperlipidemia, unspecified: Secondary | ICD-10-CM | POA: Diagnosis not present

## 2021-03-04 NOTE — Patient Instructions (Signed)
Labs today.  Continue your current medication.  Follow up annually or sooner if needed.  Take care  Dr. Lacinda Axon

## 2021-03-04 NOTE — Assessment & Plan Note (Signed)
Has been stable. Labs ordered. Continue current dosing on synthroid (pending lab results).

## 2021-03-04 NOTE — Progress Notes (Signed)
Subjective:  Patient ID: Madison Aguilar, female    DOB: [birth year]  Age: 56 y.o. MRN: [id number]  CC: Chief Complaint  Patient presents with   Hypothyroidism    Establish Care    HPI:  56 year old female presents for evaluation of the above.  Patient states that she is doing well. Does have significant fatigue. Unsure of thyroid disease is the culprit. Last thyroid panel was in June. Would like labs.   No other complaints of concerns at this time.   Patient Active Problem List   Diagnosis Date Noted   Hyperlipidemia 03/04/2021   Primary localized osteoarthritis of right hip 01/24/2018   Hypothyroidism 10/02/2012   GERD (gastroesophageal reflux disease) 09/22/2010    Social Hx   Social History   Socioeconomic History   Marital status: Married    Spouse name: Not on file   Number of children: 1   Years of education: Not on file   Highest education level: Not on file  Occupational History   Occupation: [employer]    Comment: Freight forwarder  Tobacco Use   Smoking status: Light Smoker    Packs/day: 0.25    Years: 20.00    Pack years: 5.00    Types: Cigarettes   Smokeless tobacco: Never  Vaping Use   Vaping Use: Never used  Substance and Sexual Activity   Alcohol use: Yes    Comment: a glass of wine   Drug use: No   Sexual activity: Not on file  Other Topics Concern   Not on file  Social History Narrative   Not on file   Social Determinants of Health   Financial Resource Strain: Not on file  Food Insecurity: Not on file  Transportation Needs: Not on file  Physical Activity: Not on file  Stress: Not on file  Social Connections: Not on file    Review of Systems  Constitutional:  Positive for fatigue.  Respiratory: Negative.    Cardiovascular: Negative.     Objective:  BP 107/65    Pulse 84    Ht _0  (1.676 m)    Wt 163 lb (73.9 kg)    SpO2 98%    BMI 26.31 kg/m   BP/Weight 03/04/2021 03/26/5425 0/07/2374  Systolic BP 283 151 761  Diastolic BP 65 62  73  Wt. (Lbs) 163 160.6 157  BMI 26.31 25.92 25.34    Physical Exam Constitutional:      General: She is not in acute distress.    Appearance: Normal appearance.  HENT:     Head: Normocephalic.  Cardiovascular:     Rate and Rhythm: Normal rate and regular rhythm.  Pulmonary:     Effort: Pulmonary effort is normal.     Breath sounds: Normal breath sounds. No wheezing or rales.  Neurological:     Mental Status: She is alert.  Psychiatric:        Mood and Affect: Mood normal.        Behavior: Behavior normal.    Lab Results  Component Value Date   WBC 8.7 08/13/2020   HGB 13.7 08/13/2020   HCT 42.5 08/13/2020   PLT 364 05/27/2020   GLUCOSE 94 05/27/2020   CHOL 216 (H) 07/30/2019   TRIG 92 07/30/2019   HDL 64 07/30/2019   LDLCALC 136 (H) 07/30/2019   ALT 13 05/27/2020   AST 17 05/27/2020   NA 141 05/27/2020   K 4.6 05/27/2020   CL 103 05/27/2020   CREATININE  0.88 05/27/2020   BUN 8 05/27/2020   CO2 26 05/27/2020   TSH 0.910 08/13/2020   INR 0.91 01/27/2018     Assessment & Plan:   Problem List Items Addressed This Visit       Endocrine   Hypothyroidism - Primary    Has been stable. Labs ordered. Continue current dosing on synthroid (pending lab results).      Relevant Orders   CMP14+EGFR   TSH + free T4     Other   Hyperlipidemia   Relevant Orders   Lipid panel   Other Visit Diagnoses     Other fatigue       Relevant Orders   CBC   [facility]

## 2021-03-13 LAB — CMP14+EGFR
ALT: 11 IU/L (ref 0–32)
AST: 12 IU/L (ref 0–40)
Albumin/Globulin Ratio: 1.9 (ref 1.2–2.2)
Albumin: 4.6 g/dL (ref 3.8–4.9)
Alkaline Phosphatase: 77 IU/L (ref 44–121)
BUN/Creatinine Ratio: 11 (ref 9–23)
BUN: 9 mg/dL (ref 6–24)
Bilirubin Total: 0.3 mg/dL (ref 0.0–1.2)
CO2: 26 mmol/L (ref 20–29)
Calcium: 9.6 mg/dL (ref 8.7–10.2)
Chloride: 101 mmol/L (ref 96–106)
Creatinine, Ser: 0.83 mg/dL (ref 0.57–1.00)
Globulin, Total: 2.4 g/dL (ref 1.5–4.5)
Glucose: 83 mg/dL (ref 70–99)
Potassium: 4.1 mmol/L (ref 3.5–5.2)
Sodium: 139 mmol/L (ref 134–144)
Total Protein: 7 g/dL (ref 6.0–8.5)
eGFR: 82 mL/min/{1.73_m2} (ref >59)

## 2021-03-13 LAB — LIPID PANEL
Chol/HDL Ratio: 3.1 ratio (ref 0.0–4.4)
Cholesterol, Total: 223 mg/dL — ABNORMAL HIGH (ref 100–199)
HDL: 72 mg/dL (ref >39)
LDL Chol Calc (NIH): 134 mg/dL — ABNORMAL HIGH (ref 0–99)
Triglycerides: 95 mg/dL (ref 0–149)
VLDL Cholesterol Cal: 17 mg/dL (ref 5–40)

## 2021-03-13 LAB — CBC
Hematocrit: 40.2 % (ref 34.0–46.6)
Hemoglobin: 13.4 g/dL (ref 11.1–15.9)
MCH: 30.4 pg (ref 26.6–33.0)
MCHC: 33.3 g/dL (ref 31.5–35.7)
MCV: 91 fL (ref 79–97)
Platelets: 358 10*3/uL (ref 150–450)
RBC: 4.41 x10E6/uL (ref 3.77–5.28)
RDW: 13.1 % (ref 11.7–15.4)
WBC: 9.3 10*3/uL (ref 3.4–10.8)

## 2021-03-13 LAB — TSH+FREE T4
Free T4: 1.76 ng/dL (ref 0.82–1.77)
TSH: 0.793 u[IU]/mL (ref 0.450–4.500)

## 2021-05-25 ENCOUNTER — Telehealth: Payer: Self-pay | Admitting: Family Medicine

## 2021-05-25 ENCOUNTER — Other Ambulatory Visit: Payer: Self-pay | Admitting: *Deleted

## 2021-05-25 DIAGNOSIS — E039 Hypothyroidism, unspecified: Secondary | ICD-10-CM

## 2021-05-25 MED ORDER — LEVOTHYROXINE SODIUM 75 MCG PO TABS: ORAL_TABLET | ORAL | 1 refills | Status: DC

## 2021-05-25 MED ORDER — LEVOTHYROXINE SODIUM 75 MCG PO TABS: ORAL_TABLET | ORAL | 0 refills | Status: DC

## 2021-05-25 NOTE — Telephone Encounter (Signed)
Prescription sent into pharmacy

## 2021-05-25 NOTE — Telephone Encounter (Signed)
Patient informed that 3 months supply with 1 refill was sent in. Verbalized understanding ?

## 2021-05-25 NOTE — Telephone Encounter (Signed)
Patient is requesting refill on levothyroxine 75 mg last seen on 03/04/21 . [address] ?

## 2021-06-22 ENCOUNTER — Other Ambulatory Visit: Payer: Self-pay | Admitting: Family Medicine

## 2021-06-22 DIAGNOSIS — E039 Hypothyroidism, unspecified: Secondary | ICD-10-CM

## 2021-08-03 ENCOUNTER — Telehealth: Payer: Self-pay | Admitting: *Deleted

## 2021-08-03 NOTE — Telephone Encounter (Signed)
Referring MD/PCP: Dr. Lacinda Axon  Procedure: Colonoscopy  Reason/Indication:  Recall  Has patient had this procedure before?  Yes, Dr. Gala Romney, 10/20/2010  Is there a family history of colon cancer?  no   Is patient diabetic? If yes, Type 1 or Type 2   no      Does patient have prosthetic heart valve or mechanical valve?  no  Do you have a pacemaker/defibrillator?  no  Has patient ever had endocarditis/atrial fibrillation? no  Does patient use oxygen? no  Has patient had joint replacement within last 12 months?  no  Is patient constipated or do they take laxatives? no  Does patient have a history of alcohol/drug use?  no  Have you had a stroke/heart attack last 6 mths? no  Do you take medicine for weight loss?  no  For female patients,: have you had a hysterectomy no                      are you post menopausal                       do you still have your menstrual cycle no  Is patient on blood thinner such as Coumadin, Plavix and/or Aspirin? no  Medications:  Current Outpatient Medications on File Prior to Visit  Medication Sig Dispense Refill   albuterol (VENTOLIN HFA) 108 (90 Base) MCG/ACT inhaler Inhale 2 puffs into the lungs every 6 (six) hours as needed for wheezing or shortness of breath. 8 g 0   levothyroxine (SYNTHROID) 75 MCG tablet TAKE 2 TABLETS BY MOUTH ON SUNDAYS, THEN TAKE 1 TABLET BY MOUTH DAILY ON MONDAY THROUGH SATURDAY. 32 tablet 0   No current facility-administered medications on file prior to visit.     Allergies: No Known Allergies

## 2021-08-06 MED ORDER — PEG 3350-KCL-NA BICARB-NACL 420 G PO SOLR: ORAL | 0 refills | Status: DC

## 2021-08-06 NOTE — Telephone Encounter (Signed)
Called pt. Scheduled for 7/21 at 10am. Aware will mail instructions. Rx sent to pharmacy  Per [facility] website for member Id# [id number]  "Notification or Prior Authorization is not required for the requested services  Decision ID #:[id number]"

## 2021-08-06 NOTE — Addendum Note (Signed)
Addended by: Cheron Every on: 08/06/2021 03:58 PM   Modules accepted: Orders

## 2021-08-06 NOTE — Telephone Encounter (Signed)
ASA 2. Appropriate. Last colonoscopy 2012 with diverticulosis, diminutive polyp (benign lymphoid aggregate).

## 2021-09-07 ENCOUNTER — Telehealth: Payer: Self-pay | Admitting: *Deleted

## 2021-09-07 NOTE — Telephone Encounter (Signed)
Patient called to reschedule procedure scheduled with Dr. Gala Romney on Friday 7/21. She is requesting to reschedule with Dr. Abbey Chatters instead (did not give reason why). Please advise if this is okay

## 2021-09-08 NOTE — Telephone Encounter (Signed)
LMOVM to call back 

## 2021-09-09 NOTE — Telephone Encounter (Signed)
Pt called back. She stated she had some thing going on with her schedule and will call back once she gets it figured out and schedule her procedure

## 2021-09-11 ENCOUNTER — Encounter (HOSPITAL_COMMUNITY): Admission: RE | Payer: Self-pay | Source: Home / Self Care

## 2021-09-11 ENCOUNTER — Ambulatory Visit (HOSPITAL_COMMUNITY): Admission: RE | Admit: 2021-09-11 | Payer: 59 | Source: Home / Self Care | Admitting: Internal Medicine

## 2021-09-11 SURGERY — COLONOSCOPY WITH PROPOFOL
Anesthesia: Monitor Anesthesia Care

## 2021-09-15 ENCOUNTER — Encounter: Payer: Self-pay | Admitting: *Deleted

## 2021-09-15 MED ORDER — PEG 3350-KCL-NA BICARB-NACL 420 G PO SOLR: ORAL | 0 refills | Status: DC

## 2021-09-15 NOTE — Telephone Encounter (Signed)
Checked PA. No PA required "Decision ID #:[id number]"

## 2021-09-16 ENCOUNTER — Encounter: Payer: Self-pay | Admitting: *Deleted

## 2021-09-29 ENCOUNTER — Telehealth: Payer: Self-pay | Admitting: *Deleted

## 2021-09-29 NOTE — Telephone Encounter (Signed)
LMOVM to call back to reschedule procedure on for 8/25 with Dr. Abbey Chatters.

## 2021-09-30 ENCOUNTER — Telehealth: Payer: Self-pay | Admitting: *Deleted

## 2021-09-30 NOTE — Telephone Encounter (Signed)
See prior note. Pt transferring care

## 2021-09-30 NOTE — Telephone Encounter (Signed)
Patient called in. She wants to transfer care to [facility]. She requested records be sent to them. Pt aware needs to come by office and sign release of records. She states she will come by tomorrow to do so. Fowarding to Manuela Schwartz as an FYI and to Dr. Abbey Chatters

## 2021-10-07 NOTE — Telephone Encounter (Signed)
Pt signed release, records sent

## 2021-10-16 ENCOUNTER — Ambulatory Visit (HOSPITAL_COMMUNITY): Admit: 2021-10-16 | Payer: Self-pay

## 2021-10-16 ENCOUNTER — Encounter (HOSPITAL_COMMUNITY): Payer: Self-pay

## 2021-10-16 ENCOUNTER — Telehealth: Payer: Self-pay | Admitting: Podiatry

## 2021-10-16 SURGERY — COLONOSCOPY WITH PROPOFOL
Anesthesia: Monitor Anesthesia Care

## 2021-10-16 NOTE — Telephone Encounter (Signed)
Left message on voicemail for patient to call back about her insurance is coming back inactive.

## 2021-10-17 ENCOUNTER — Ambulatory Visit (INDEPENDENT_AMBULATORY_CARE_PROVIDER_SITE_OTHER): Payer: 59

## 2021-10-17 ENCOUNTER — Ambulatory Visit (INDEPENDENT_AMBULATORY_CARE_PROVIDER_SITE_OTHER): Payer: 59 | Admitting: Podiatry

## 2021-10-17 ENCOUNTER — Encounter: Payer: Self-pay | Admitting: Podiatry

## 2021-10-17 DIAGNOSIS — Q828 Other specified congenital malformations of skin: Secondary | ICD-10-CM

## 2021-10-17 DIAGNOSIS — B070 Plantar wart: Secondary | ICD-10-CM | POA: Diagnosis not present

## 2021-10-17 NOTE — Progress Notes (Signed)
Subjective:   Patient ID: Madison Aguilar, female   DOB: 56 y.o.   MRN: [id number]   HPI Patient presents after being seen 8 years ago with discomfort with a chronic lesion bottom of left foot that has been giving her a lot of problems and she has had it trimmed monthly and its not been effective.  Also was concerned about chronic nail disease of the big toenails of both feet.  Patient does not smoke likes to be active   Review of Systems  All other systems reviewed and are negative.       Objective:  Physical Exam Vitals and nursing note reviewed.  Constitutional:      Appearance: She is well-developed.  Pulmonary:     Effort: Pulmonary effort is normal.  Musculoskeletal:        General: Normal range of motion.  Skin:    General: Skin is warm.  Neurological:     Mental Status: She is alert.     Neurovascular status intact muscle strength adequate range of motion adequate with patient found to have a lesion proximal to the third metatarsal head left painful when pressed with several other lesions noted around that are not sore.  There is slight pinpoint bleeding upon debridement also has a lucent type core.  Good digital perfusion well oriented x3 thick hallux nails bilateral     Assessment:  Possibility that this is a verruca plantaris versus a possible porokeratosis possible bone involvement     Plan:  H&P x-rays reviewed today Sharp sterile debridement of the lesion accomplished no iatrogenic bleeding currently and I went ahead today and I applied a chemical agent to create immune response with sterile dressing and I advised on more aggressive treatment at 1 point future we could consider but there is no magic for this  X-rays indicate that it is proximal to the third metatarsal head left

## 2021-10-19 ENCOUNTER — Telehealth: Payer: Self-pay

## 2021-10-19 NOTE — Telephone Encounter (Signed)
Received records left  voicemail to ask if the patient has a referred provider and reason for transfer\records review.

## 2021-10-22 ENCOUNTER — Telehealth: Payer: Self-pay | Admitting: Gastroenterology

## 2021-10-22 NOTE — Telephone Encounter (Signed)
Hi Dr. Fuller Plan,  We received records from this patient requesting a transfer of care to you.  Her records are being sent to you for your review and consideration.  Please let me know how you would like to proceed.  Thank you.

## 2021-10-23 ENCOUNTER — Other Ambulatory Visit: Payer: Self-pay

## 2021-10-23 DIAGNOSIS — E039 Hypothyroidism, unspecified: Secondary | ICD-10-CM

## 2021-10-23 MED ORDER — LEVOTHYROXINE SODIUM 75 MCG PO TABS: ORAL_TABLET | ORAL | 0 refills | Status: DC

## 2021-10-26 NOTE — Telephone Encounter (Signed)
OK to transfer per patient request

## 2021-10-27 ENCOUNTER — Encounter: Payer: Self-pay | Admitting: Gastroenterology

## 2021-11-25 ENCOUNTER — Ambulatory Visit (AMBULATORY_SURGERY_CENTER): Payer: Self-pay | Admitting: *Deleted

## 2021-11-25 VITALS — Ht 66.0 in | Wt 166.8 lb

## 2021-11-25 DIAGNOSIS — Z86010 Personal history of colonic polyps: Secondary | ICD-10-CM

## 2021-11-25 MED ORDER — NA SULFATE-K SULFATE-MG SULF 17.5-3.13-1.6 GM/177ML PO SOLN: 1.0000 | Freq: Once | ORAL | 0 refills | Status: AC

## 2021-11-25 NOTE — Progress Notes (Signed)
No egg or soy allergy known to patient  No issues known to pt with past sedation with any surgeries or procedures Patient denies ever being told they had issues or difficulty with intubation  No FH of Malignant Hyperthermia Pt is not on diet pills Pt is not on home 02  Pt is not on blood thinners  Pt denies issues with constipation  No A fib or A flutter Have any cardiac testing pending--NO Pt instructed to use Singlecare.com or GoodRx for a price reduction on prep   

## 2021-12-02 ENCOUNTER — Encounter: Payer: Self-pay | Admitting: Gastroenterology

## 2021-12-08 ENCOUNTER — Encounter: Payer: Self-pay | Admitting: Gastroenterology

## 2021-12-16 ENCOUNTER — Encounter: Payer: Self-pay | Admitting: Gastroenterology

## 2021-12-16 ENCOUNTER — Ambulatory Visit (AMBULATORY_SURGERY_CENTER): Payer: 59 | Admitting: Gastroenterology

## 2021-12-16 VITALS — BP 99/50 | HR 54 | Temp 97.5°F | Resp 13 | Ht 66.0 in | Wt 166.8 lb

## 2021-12-16 DIAGNOSIS — Z1211 Encounter for screening for malignant neoplasm of colon: Secondary | ICD-10-CM | POA: Diagnosis present

## 2021-12-16 DIAGNOSIS — D125 Benign neoplasm of sigmoid colon: Secondary | ICD-10-CM

## 2021-12-16 DIAGNOSIS — K635 Polyp of colon: Secondary | ICD-10-CM | POA: Diagnosis not present

## 2021-12-16 MED ORDER — SODIUM CHLORIDE 0.9 % IV SOLN: 500.0000 mL | Freq: Once | INTRAVENOUS | Status: DC

## 2021-12-16 NOTE — Progress Notes (Signed)
Sedate, gd SR, tolerated procedure well, VSS, report to RN 

## 2021-12-16 NOTE — Patient Instructions (Signed)
Handout on diverticulosis and polyps given to patient. Await pathology results. Resume previous diet and continue present medications. Repeat colonoscopy for surveillance will be determined based off of pathology results.   YOU HAD AN ENDOSCOPIC PROCEDURE TODAY AT THE [facility]:   Refer to the procedure report that was given to you for any specific questions about what was found during the examination.  If the procedure report does not answer your questions, please call your gastroenterologist to clarify.  If you requested that your care partner not be given the details of your procedure findings, then the procedure report has been included in a sealed envelope for you to review at your convenience later.  YOU SHOULD EXPECT: Some feelings of bloating in the abdomen. Passage of more gas than usual.  Walking can help get rid of the air that was put into your GI tract during the procedure and reduce the bloating. If you had a lower endoscopy (such as a colonoscopy or flexible sigmoidoscopy) you may notice spotting of blood in your stool or on the toilet paper. If you underwent a bowel prep for your procedure, you may not have a normal bowel movement for a few days.  Please Note:  You might notice some irritation and congestion in your nose or some drainage.  This is from the oxygen used during your procedure.  There is no need for concern and it should clear up in a day or so.  SYMPTOMS TO REPORT IMMEDIATELY:  Following lower endoscopy (colonoscopy or flexible sigmoidoscopy):  Excessive amounts of blood in the stool  Significant tenderness or worsening of abdominal pains  Swelling of the abdomen that is new, acute  Fever of 100F or higher  For urgent or emergent issues, a gastroenterologist can be reached at any hour by calling [phone]. Do not use MyChart messaging for urgent concerns.    DIET:  We do recommend a small meal at first, but then you may proceed to your  regular diet.  Drink plenty of fluids but you should avoid alcoholic beverages for 24 hours.  ACTIVITY:  You should plan to take it easy for the rest of today and you should NOT DRIVE or use heavy machinery until tomorrow (because of the sedation medicines used during the test).    FOLLOW UP: Our staff will call the number listed on your records the next business day following your procedure.  We will call around 7:15- 8:00 am to check on you and address any questions or concerns that you may have regarding the information given to you following your procedure. If we do not reach you, we will leave a message.     If any biopsies were taken you will be contacted by phone or by letter within the next 1-3 weeks.  Please call us at [phone] if you have not heard about the biopsies in 3 weeks.    SIGNATURES/CONFIDENTIALITY: You and/or your care partner have signed paperwork which will be entered into your electronic medical record.  These signatures attest to the fact that that the information above on your After Visit Summary has been reviewed and is understood.  Full responsibility of the confidentiality of this discharge information lies with you and/or your care-partner. 

## 2021-12-16 NOTE — Progress Notes (Signed)
History & Physical  Primary Care Physician:  Coral Spikes, DO Primary Gastroenterologist: Lucio Edward, MD  CHIEF COMPLAINT:  CRC screening   HPI: Madison Aguilar is a 56 y.o. female here for CRC screening, average risk, with colonoscopy.   Past Medical History:  Diagnosis Date   Arthritis    Right hip   Diverticulosis 09/2010   Left side, noted on colonoscopy   GERD (gastroesophageal reflux disease)    History of anemia    History of colon polyps 09/2010   Hypothyroid    Low blood pressure    Migraine headache    Plantar fasciitis    Squamous cell carcinoma of skin 11/12/2015   KA on right outer thigh   Tinnitus, bilateral    Varicose vein of leg     Past Surgical History:  Procedure Laterality Date   Bilateral great toe surgery     COLONOSCOPY W/ POLYPECTOMY  2012   DILITATION & CURRETTAGE/HYSTROSCOPY WITH NOVASURE ABLATION  03/03/2012   Procedure: DILATATION & CURETTAGE/HYSTEROSCOPY WITH NOVASURE ABLATION;  Surgeon: Sharene Butters, MD;  Location: [facility];  Service: Gynecology;  Laterality: N/A;   PLANTAR FASCIA RELEASE     TOTAL HIP ARTHROPLASTY Right 02/03/2018   Procedure: TOTAL HIP ARTHROPLASTY;  Surgeon: Earlie Server, MD;  Location: [facility];  Service: Orthopedics;  Laterality: Right;    Prior to Admission medications   Medication Sig Start Date End Date Taking? Authorizing Provider  levothyroxine (SYNTHROID) 75 MCG tablet TAKE 2 TABLETS BY MOUTH ON SUNDAYS, THEN TAKE 1 TABLET BY MOUTH DAILY ON MONDAY THROUGH SATURDAY. 10/23/21  Yes Cook, Jayce G, DO  albuterol (VENTOLIN HFA) 108 (90 Base) MCG/ACT inhaler Inhale 2 puffs into the lungs every 6 (six) hours as needed for wheezing or shortness of breath. Patient not taking: Reported on 11/25/2021 12/25/20   Mar Daring, PA-C    Current Outpatient Medications  Medication Sig Dispense Refill   levothyroxine (SYNTHROID) 75 MCG tablet TAKE 2 TABLETS BY MOUTH ON SUNDAYS, THEN TAKE 1 TABLET BY MOUTH DAILY ON  MONDAY THROUGH SATURDAY. 32 tablet 0   albuterol (VENTOLIN HFA) 108 (90 Base) MCG/ACT inhaler Inhale 2 puffs into the lungs every 6 (six) hours as needed for wheezing or shortness of breath. (Patient not taking: Reported on 11/25/2021) 8 g 0   Current Facility-Administered Medications  Medication Dose Route Frequency Provider Last Rate Last Admin   0.9 %  sodium chloride infusion  500 mL Intravenous Once Ladene Artist, MD        Allergies as of 12/16/2021   (No Known Allergies)    Family History  Problem Relation Age of Onset   Diverticulitis Mother        ileostomy   Diverticulitis Maternal Grandmother    Colon cancer Neg Hx    Liver disease Neg Hx    Crohn's disease Neg Hx    Ulcerative colitis Neg Hx    Colon polyps Neg Hx    Esophageal cancer Neg Hx    Rectal cancer Neg Hx    Stomach cancer Neg Hx     Social History   Socioeconomic History   Marital status: Married    Spouse name: Not on file   Number of children: 1   Years of education: Not on file   Highest education level: Not on file  Occupational History   Occupation: [employer]    Comment: Freight forwarder  Tobacco Use   Smoking status: Light Smoker  Packs/day: 0.25    Years: 20.00    Total pack years: 5.00    Types: Cigarettes    Passive exposure: Never   Smokeless tobacco: Never  Vaping Use   Vaping Use: Never used  Substance and Sexual Activity   Alcohol use: Yes    Comment: a glass of wine,1-2 X A YEAR   Drug use: No   Sexual activity: Not on file  Other Topics Concern   Not on file  Social History Narrative   Not on file   Social Determinants of Health   Financial Resource Strain: Not on file  Food Insecurity: Not on file  Transportation Needs: Not on file  Physical Activity: Not on file  Stress: Not on file  Social Connections: Not on file  Intimate Partner Violence: Not on file    Review of Systems:  All systems reviewed were negative except where noted in HPI.   Physical  Exam: General:  Alert, well-developed, in NAD Head:  Normocephalic and atraumatic. Eyes:  Sclera clear, no icterus.   Conjunctiva pink. Ears:  Normal auditory acuity. Mouth:  No deformity or lesions.  Neck:  Supple; no masses . Lungs:  Clear throughout to auscultation.   No wheezes, crackles, or rhonchi. No acute distress. Heart:  Regular rate and rhythm; no murmurs. Abdomen:  Soft, nondistended, nontender. No masses, hepatomegaly. No obvious masses.  Normal bowel .    Rectal:  Deferred   Msk:  Symmetrical without gross deformities.. Pulses:  Normal pulses noted. Extremities:  Without edema. Neurologic:  Alert and  oriented x4;  grossly normal neurologically. Skin:  Intact without significant lesions or rashes. Cervical Nodes:  No significant cervical adenopathy. Psych:  Alert and cooperative. Normal mood and affect.  Impression / Plan:   CRC screening, average risk, for colonoscopy.  Pricilla Riffle. Fuller Plan  12/16/2021, 9:21 AM See Shea Evans, [facility], to contact our on call provider

## 2021-12-16 NOTE — Progress Notes (Signed)
Pt's states no medical or surgical changes since previsit or office visit. 

## 2021-12-16 NOTE — Op Note (Signed)
[facility] Patient Name: Madison Aguilar Procedure Date: 12/16/2021 9:19 AM MRN: [id number] Endoscopist: Ladene Artist , MD, [id number] Age: 56 Referring MD:  Date of Birth: 05-Apr-1965 Gender: Female Account #: [id number] Procedure:                Colonoscopy Indications:              Screening for colorectal malignant neoplasm Medicines:                Monitored Anesthesia Care Procedure:                Pre-Anesthesia Assessment:                           - Prior to the procedure, a History and Physical                            was performed, and patient medications and                            allergies were reviewed. The patient's tolerance of                            previous anesthesia was also reviewed. The risks                            and benefits of the procedure and the sedation                            options and risks were discussed with the patient.                            All questions were answered, and informed consent                            was obtained. Prior Anticoagulants: The patient has                            taken no anticoagulant or antiplatelet agents. ASA                            Grade Assessment: II - A patient with mild systemic                            disease. After reviewing the risks and benefits,                            the patient was deemed in satisfactory condition to                            undergo the procedure.                           After obtaining informed consent, the colonoscope  was passed under direct vision. Throughout the                            procedure, the patient's blood pressure, pulse, and                            oxygen saturations were monitored continuously. The                            Olympus PCF-H190DL [id number]) Colonoscope was                            introduced through the anus and advanced to the the                            cecum,  identified by appendiceal orifice and                            ileocecal valve. The ileocecal valve, appendiceal                            orifice, and rectum were photographed. The quality                            of the bowel preparation was good. The colonoscopy                            was performed without difficulty. The patient                            tolerated the procedure well. Scope In: 9:31:16 AM Scope Out: 9:49:45 AM Scope Withdrawal Time: 0 hours 15 minutes 7 seconds  Total Procedure Duration: 0 hours 18 minutes 29 seconds  Findings:                 The perianal and digital rectal examinations were                            normal.                           Two sessile polyps were found in the sigmoid colon.                            The polyps were 8 mm in size. These polyps were                            removed with a cold snare. Resection and retrieval                            were complete.                           Multiple medium-mouthed diverticula were found in  the left colon. There was no evidence of                            diverticular bleeding.                           The exam was otherwise without abnormality on                            direct and retroflexion views. Complications:            No immediate complications. Estimated blood loss:                            None. Estimated Blood Loss:     Estimated blood loss: none. Impression:               - Two 8 mm polyps in the sigmoid colon, removed                            with a cold snare. Resected and retrieved.                           - Mild diverticulosis in the left colon.                           - The examination was otherwise normal on direct                            and retroflexion views. Recommendation:           - Repeat colonoscopy after studies are complete for                            surveillance of multiple polyps.                            - Patient has a contact number available for                            emergencies. The signs and symptoms of potential                            delayed complications were discussed with the                            patient. Return to normal activities tomorrow.                            Written discharge instructions were provided to the                            patient.                           - High fiber diet.                           -  Continue present medications.                           - Await pathology results. Ladene Artist, MD 12/16/2021 9:53:05 AM This report has been signed electronically.

## 2021-12-16 NOTE — Progress Notes (Signed)
Called to room to assist during endoscopic procedure.  Patient ID and intended procedure confirmed with present staff. Received instructions for my participation in the procedure from the performing physician.  

## 2021-12-17 ENCOUNTER — Telehealth: Payer: Self-pay

## 2021-12-17 NOTE — Telephone Encounter (Signed)
  Follow up Call-     12/16/2021    8:06 AM  Call back number  Post procedure Call Back phone  # [phone]  Permission to leave phone message Yes     Patient questions:  Do you have a fever, pain , or abdominal swelling? No. Pain Score  0 *  Have you tolerated food without any problems? Yes.    Have you been able to return to your normal activities? Yes.    Do you have any questions about your discharge instructions: Diet   No. Medications  No. Follow up visit  No.  Do you have questions or concerns about your Care? No.  Actions: * If pain score is 4 or above: No action needed, pain <4.

## 2021-12-25 ENCOUNTER — Other Ambulatory Visit: Payer: Self-pay | Admitting: Family Medicine

## 2021-12-25 DIAGNOSIS — E039 Hypothyroidism, unspecified: Secondary | ICD-10-CM

## 2021-12-31 ENCOUNTER — Encounter: Payer: Self-pay | Admitting: Gastroenterology

## 2022-01-18 ENCOUNTER — Ambulatory Visit (INDEPENDENT_AMBULATORY_CARE_PROVIDER_SITE_OTHER): Payer: 59 | Admitting: Family Medicine

## 2022-01-18 VITALS — BP 122/72 | Temp 98.7°F | Ht 66.0 in | Wt 166.4 lb

## 2022-01-18 DIAGNOSIS — R051 Acute cough: Secondary | ICD-10-CM | POA: Diagnosis not present

## 2022-01-18 DIAGNOSIS — B349 Viral infection, unspecified: Secondary | ICD-10-CM | POA: Insufficient documentation

## 2022-01-18 MED ORDER — PROMETHAZINE-DM 6.25-15 MG/5ML PO SYRP: 5.0000 mL | ORAL_SOLUTION | Freq: Four times a day (QID) | ORAL | 0 refills | Status: DC | PRN

## 2022-01-18 MED ORDER — PREDNISONE 50 MG PO TABS
ORAL_TABLET | ORAL | 0 refills | Status: DC
Start: 2022-01-18 — End: 2022-03-04

## 2022-01-18 NOTE — Progress Notes (Signed)
Subjective:  Patient ID: Madison Aguilar, female    DOB: [birth year]  Age: 56 y.o. MRN: [id number]  CC: Chief Complaint  Patient presents with   Cough    Congestion- congestion is in chest- started last night Headache and left ear discomfort    HPI:  56 year old female presents for evaluation of the above.  Patient reports that her symptoms started yesterday. She reports cough, headache, left ear discomfort. Cough is the predominant symptom. Husband has had ongoing cough. No fever. Associated fatigue as well. No medications or interventions tried. Cough worse at night.  Patient Active Problem List   Diagnosis Date Noted   Viral illness 01/18/2022   Hyperlipidemia 03/04/2021   Primary localized osteoarthritis of right hip 01/24/2018   Hypothyroidism 10/02/2012   GERD (gastroesophageal reflux disease) 09/22/2010    Social Hx   Social History   Socioeconomic History   Marital status: Married    Spouse name: Not on file   Number of children: 1   Years of education: Not on file   Highest education level: Not on file  Occupational History   Occupation: [employer]    Comment: Freight forwarder  Tobacco Use   Smoking status: Light Smoker    Packs/day: 0.25    Years: 20.00    Total pack years: 5.00    Types: Cigarettes    Passive exposure: Never   Smokeless tobacco: Never  Vaping Use   Vaping Use: Never used  Substance and Sexual Activity   Alcohol use: Yes    Comment: a glass of wine,1-2 X A YEAR   Drug use: No   Sexual activity: Not on file  Other Topics Concern   Not on file  Social History Narrative   Not on file   Social Determinants of Health   Financial Resource Strain: Not on file  Food Insecurity: Not on file  Transportation Needs: Not on file  Physical Activity: Not on file  Stress: Not on file  Social Connections: Not on file    Review of Systems Per HPI  Objective:  BP 122/72   Temp 98.7 F (37.1 C) (Oral)   Ht '5\' 6"'$  (1.676 m)   Wt 166 lb 6.4  oz (75.5 kg)   BMI 26.86 kg/m      01/18/2022    4:14 PM 12/16/2021   10:12 AM 12/16/2021   10:02 AM  BP/Weight  Systolic BP 270 99 90  Diastolic BP 72 50 48  Wt. (Lbs) 166.4    BMI 26.86 kg/m2      Physical Exam Vitals and nursing note reviewed.  Constitutional:      Appearance: Normal appearance.  HENT:     Head: Normocephalic and atraumatic.     Right Ear: Tympanic membrane normal.     Left Ear: Tympanic membrane normal.     Mouth/Throat:     Pharynx: Oropharynx is clear.  Eyes:     Conjunctiva/sclera: Conjunctivae normal.  Cardiovascular:     Rate and Rhythm: Normal rate and regular rhythm.  Pulmonary:     Effort: Pulmonary effort is normal.     Breath sounds: Wheezing present.  Neurological:     Mental Status: She is alert.     Lab Results  Component Value Date   WBC 9.3 03/12/2021   HGB 13.4 03/12/2021   HCT 40.2 03/12/2021   PLT 358 03/12/2021   GLUCOSE 83 03/12/2021   CHOL 223 (H) 03/12/2021   TRIG 95 03/12/2021  HDL 72 03/12/2021   LDLCALC 134 (H) 03/12/2021   ALT 11 03/12/2021   AST 12 03/12/2021   NA 139 03/12/2021   K 4.1 03/12/2021   CL 101 03/12/2021   CREATININE 0.83 03/12/2021   BUN 9 03/12/2021   CO2 26 03/12/2021   TSH 0.793 03/12/2021   INR 0.91 01/27/2018     Assessment & Plan:   Problem List Items Addressed This Visit       Other   Viral illness - Primary    Suspected viral illness (given acute onset of symptoms).  Awaiting COVID/Flu/RSV testing.  Starting on Prednisone given wheezing on exam. Promethazine DM for cough.       Relevant Orders   COVID-19, Flu A+B and RSV   Other Visit Diagnoses     Acute cough       Relevant Orders   COVID-19, Flu A+B and RSV       Meds ordered this encounter  Medications   predniSONE (DELTASONE) 50 MG tablet    Sig: 1 tablet daily x 5 days    Dispense:  5 tablet    Refill:  0   promethazine-dextromethorphan (PROMETHAZINE-DM) 6.25-15 MG/5ML syrup    Sig: Take 5 mLs by  mouth 4 (four) times daily as needed for cough.    Dispense:  118 mL    Refill:  0    Follow-up:  Return if symptoms worsen or fail to improve.  [place]

## 2022-01-18 NOTE — Assessment & Plan Note (Signed)
Suspected viral illness (given acute onset of symptoms).  Awaiting COVID/Flu/RSV testing.  Starting on Prednisone given wheezing on exam. Promethazine DM for cough.

## 2022-01-18 NOTE — Patient Instructions (Signed)
Rest. Lots of fluids.  I suspect that this is viral. Awaiting test results.  Medication as prescribed.   Take care  Dr. Lacinda Axon

## 2022-01-20 LAB — COVID-19, FLU A+B AND RSV
Influenza A, NAA: NOT DETECTED
Influenza B, NAA: NOT DETECTED
RSV, NAA: NOT DETECTED
SARS-CoV-2, NAA: NOT DETECTED

## 2022-01-20 LAB — SPECIMEN STATUS REPORT

## 2022-01-21 ENCOUNTER — Telehealth: Payer: Self-pay | Admitting: Family Medicine

## 2022-01-21 DIAGNOSIS — E039 Hypothyroidism, unspecified: Secondary | ICD-10-CM

## 2022-01-21 NOTE — Telephone Encounter (Signed)
Pt requesting blood work to have thyroid levels checked. Pt states she is nearing the end of her script. Last labs completed 03/12/21 TSH+free T4, Lipid, CMP14+EGFR and CBC. Please advise. Thank you

## 2022-01-22 NOTE — Addendum Note (Signed)
Addended by: Vicente Males on: 01/22/2022 08:44 AM   Modules accepted: Orders

## 2022-01-22 NOTE — Telephone Encounter (Signed)
Lab orders placed and pt is aware 

## 2022-02-02 LAB — TSH: TSH: 1.6 u[IU]/mL (ref 0.450–4.500)

## 2022-02-03 ENCOUNTER — Other Ambulatory Visit: Payer: Self-pay | Admitting: Family Medicine

## 2022-02-03 DIAGNOSIS — E039 Hypothyroidism, unspecified: Secondary | ICD-10-CM

## 2022-03-04 ENCOUNTER — Ambulatory Visit (INDEPENDENT_AMBULATORY_CARE_PROVIDER_SITE_OTHER): Payer: 59 | Admitting: Family Medicine

## 2022-03-04 DIAGNOSIS — J019 Acute sinusitis, unspecified: Secondary | ICD-10-CM

## 2022-03-04 MED ORDER — OLOPATADINE HCL 0.2 % OP SOLN: OPHTHALMIC | 0 refills | Status: DC

## 2022-03-04 MED ORDER — AZITHROMYCIN 250 MG PO TABS: ORAL_TABLET | ORAL | 0 refills | Status: AC

## 2022-03-04 NOTE — Assessment & Plan Note (Signed)
Empiric antibiotic therapy while awaiting viral testing. Pataday for eye watering/burning.

## 2022-03-04 NOTE — Patient Instructions (Addendum)
Medications as prescribed.  We will call with lab results.  Take care  Dr. Lacinda Axon

## 2022-03-04 NOTE — Progress Notes (Signed)
Subjective:  Patient ID: Madison Aguilar, female    DOB: 09/11/1965  Age: 57 y.o. MRN: [id number]  CC: Chief Complaint  Patient presents with   Nasal Congestion   Cough   Sore Throat    Headache, body aches started yesterday     HPI:  57 year old female presents for evaluation of respiratory symptoms.  Patient reports that her husband has been sick recently.  She works with the public.  She states that her symptoms started yesterday.  She reports sinus pressure, watery eyes, headache, congestion.  No documented fever.  She feels very poorly.  No relieving factors.  No other complaints.  Patient Active Problem List   Diagnosis Date Noted   Acute rhinosinusitis 03/04/2022   Hyperlipidemia 03/04/2021   Primary localized osteoarthritis of right hip 01/24/2018   Hypothyroidism 10/02/2012   GERD (gastroesophageal reflux disease) 09/22/2010    Social Hx   Social History   Socioeconomic History   Marital status: Married    Spouse name: Not on file   Number of children: 1   Years of education: Not on file   Highest education level: Not on file  Occupational History   Occupation: [employer]    Comment: Freight forwarder  Tobacco Use   Smoking status: Light Smoker    Packs/day: 0.25    Years: 20.00    Total pack years: 5.00    Types: Cigarettes    Passive exposure: Never   Smokeless tobacco: Never  Vaping Use   Vaping Use: Never used  Substance and Sexual Activity   Alcohol use: Yes    Comment: a glass of wine,1-2 X A YEAR   Drug use: No   Sexual activity: Not on file  Other Topics Concern   Not on file  Social History Narrative   Not on file   Social Determinants of Health   Financial Resource Strain: Not on file  Food Insecurity: Not on file  Transportation Needs: Not on file  Physical Activity: Not on file  Stress: Not on file  Social Connections: Not on file    Review of Systems Per HPI  Objective:  BP 110/80   Pulse 64   Temp (!) 97.2 F (36.2 C)  (Oral)   Wt 168 lb 6.4 oz (76.4 kg)   SpO2 99%   BMI 27.18 kg/m      03/04/2022    3:11 PM 01/18/2022    4:14 PM 12/16/2021   10:12 AM  BP/Weight  Systolic BP 209 470 99  Diastolic BP 80 72 50  Wt. (Lbs) 168.4 166.4   BMI 27.18 kg/m2 26.86 kg/m2     Physical Exam Vitals and nursing note reviewed.  Constitutional:      General: She is not in acute distress.    Appearance: Normal appearance.  HENT:     Head: Normocephalic and atraumatic.     Right Ear: Tympanic membrane normal.     Left Ear: Tympanic membrane normal.     Mouth/Throat:     Pharynx: Oropharynx is clear.  Eyes:     General:        Right eye: No discharge.        Left eye: No discharge.     Conjunctiva/sclera: Conjunctivae normal.  Cardiovascular:     Rate and Rhythm: Normal rate and regular rhythm.  Pulmonary:     Effort: Pulmonary effort is normal.     Breath sounds: Normal breath sounds. No wheezing, rhonchi or rales.  Neurological:  Mental Status: She is alert.     Lab Results  Component Value Date   WBC 9.3 03/12/2021   HGB 13.4 03/12/2021   HCT 40.2 03/12/2021   PLT 358 03/12/2021   GLUCOSE 83 03/12/2021   CHOL 223 (H) 03/12/2021   TRIG 95 03/12/2021   HDL 72 03/12/2021   LDLCALC 134 (H) 03/12/2021   ALT 11 03/12/2021   AST 12 03/12/2021   NA 139 03/12/2021   K 4.1 03/12/2021   CL 101 03/12/2021   CREATININE 0.83 03/12/2021   BUN 9 03/12/2021   CO2 26 03/12/2021   TSH 1.600 02/01/2022   INR 0.91 01/27/2018     Assessment & Plan:   Problem List Items Addressed This Visit       Respiratory   Acute rhinosinusitis    Empiric antibiotic therapy while awaiting viral testing. Pataday for eye watering/burning.      Relevant Medications   azithromycin (ZITHROMAX) 250 MG tablet   Olopatadine HCl (PATADAY) 0.2 % SOLN   Other Relevant Orders   COVID-19, Flu A+B and RSV    Meds ordered this encounter  Medications   azithromycin (ZITHROMAX) 250 MG tablet    Sig: Take 2  tablets on day 1, then 1 tablet daily on days 2 through 5    Dispense:  6 tablet    Refill:  0   Olopatadine HCl (PATADAY) 0.2 % SOLN    Sig: 1 drop to each eye daily.    Dispense:  2.5 mL    Refill:  [place]

## 2022-03-05 LAB — COVID-19, FLU A+B AND RSV
Influenza A, NAA: NOT DETECTED
Influenza B, NAA: NOT DETECTED
RSV, NAA: NOT DETECTED
SARS-CoV-2, NAA: NOT DETECTED

## 2022-03-15 ENCOUNTER — Ambulatory Visit: Payer: 59 | Admitting: Family Medicine

## 2022-04-16 ENCOUNTER — Telehealth: Payer: Self-pay | Admitting: Family Medicine

## 2022-04-16 ENCOUNTER — Other Ambulatory Visit: Payer: Self-pay | Admitting: Family Medicine

## 2022-04-16 MED ORDER — DICLOFENAC SODIUM 75 MG PO TBEC: 75.0000 mg | DELAYED_RELEASE_TABLET | Freq: Two times a day (BID) | ORAL | 0 refills | Status: DC

## 2022-04-16 MED ORDER — CHLORZOXAZONE 500 MG PO TABS: ORAL_TABLET | ORAL | 0 refills | Status: DC

## 2022-04-16 MED ORDER — NIRMATRELVIR/RITONAVIR (PAXLOVID)TABLET: 3.0000 | ORAL_TABLET | Freq: Two times a day (BID) | ORAL | 0 refills | Status: AC

## 2022-04-16 NOTE — Telephone Encounter (Signed)
Please advise. Thank you

## 2022-04-16 NOTE — Telephone Encounter (Signed)
Patient tested positive for Covid this morning, cough,congestion. Her husband had covid last week. [facility]

## 2022-04-16 NOTE — Telephone Encounter (Signed)
Per provider-provider will call patient around 5. Pt contacted and verbalized understanding.

## 2022-04-18 NOTE — Telephone Encounter (Signed)
I had a phone conversation with the patient She is having COVID symptoms no high fevers but some body aches congestion cough her weight puts her at slight risk but her age is good no heart problems no lung problems after shared discussion Paxlovid was sent in to be placed on hold through the weekend if she gets worse over the weekend she is to get it filled if she is going to utilize medicine she will do so starting Saturday or Sunday she will notify us if getting progressively worse Patient [place] any significant shortness of breath states currently not having fevers or chills

## 2022-04-27 ENCOUNTER — Other Ambulatory Visit: Payer: Self-pay | Admitting: Family Medicine

## 2022-04-27 DIAGNOSIS — Z1231 Encounter for screening mammogram for malignant neoplasm of breast: Secondary | ICD-10-CM

## 2022-05-26 ENCOUNTER — Telehealth: Payer: Self-pay

## 2022-05-26 DIAGNOSIS — E039 Hypothyroidism, unspecified: Secondary | ICD-10-CM

## 2022-05-26 MED ORDER — LEVOTHYROXINE SODIUM 75 MCG PO TABS: ORAL_TABLET | ORAL | 1 refills | Status: DC

## 2022-05-26 NOTE — Telephone Encounter (Signed)
Prescription Request  05/26/2022  LOV: Visit date not found  What is the name of the medication or equipment? levothyroxine (SYNTHROID) 75 MCG tablet Pt is wanting a 6 month or year supply so she does not have to keep calling in each month to request a refill   Have you contacted your pharmacy to request a refill? Yes   Which pharmacy would you like this sent to?  [facility] [phone] - [place], [place] [address] [place] [id number] [place] [address] Phone: [phone] Fax: [phone]    Patient notified that their request is being sent to the clinical staff for review and that they should receive a response within 2 business days.   Please advise at Mobile [phone] (mobile)

## 2022-06-08 ENCOUNTER — Ambulatory Visit: Payer: 59 | Admitting: Family Medicine

## 2022-06-08 VITALS — BP 101/64 | HR 58 | Temp 98.3°F | Ht 66.0 in | Wt 165.0 lb

## 2022-06-08 DIAGNOSIS — R21 Rash and other nonspecific skin eruption: Secondary | ICD-10-CM | POA: Diagnosis not present

## 2022-06-08 MED ORDER — TRIAMCINOLONE ACETONIDE 0.5 % EX OINT: 1.0000 | TOPICAL_OINTMENT | Freq: Two times a day (BID) | CUTANEOUS | 0 refills | Status: DC

## 2022-06-08 NOTE — Assessment & Plan Note (Signed)
Patient concerned about shingles.  Exam is not consistent with shingles.  Areas are dry and mildly erythematous.  Trial of triamcinolone.  Referral to dermatology placed.

## 2022-06-08 NOTE — Patient Instructions (Signed)
Medication as prescribed.  Referral placed.  Take care  Dr. Barnard Sharps  

## 2022-06-08 NOTE — Progress Notes (Signed)
Subjective:  Patient ID: Madison Aguilar, female    DOB: 1965-10-11  Age: 57 y.o. MRN: [id number]  CC: Chief Complaint  Patient presents with   Rash    Located on back, chest, arms and some on right hip wants to rule out shingles Reports pain and itching     HPI:  57 year old female presents for evaluation of rash.  Has had a rash for couple weeks.  Located on the upper back, chest, arms, and right hip.  Rash is itchy.  No significant pain.  Rash is not vesicular.  No new changes, contact, or exposures.  No relieving factors.  No other complaints.  Patient Active Problem List   Diagnosis Date Noted   Rash 06/08/2022   Hyperlipidemia 03/04/2021   Primary localized osteoarthritis of right hip 01/24/2018   Hypothyroidism 10/02/2012   GERD (gastroesophageal reflux disease) 09/22/2010    Social Hx   Social History   Socioeconomic History   Marital status: Married    Spouse name: Not on file   Number of children: 1   Years of education: Not on file   Highest education level: Not on file  Occupational History   Occupation: [employer]    Comment: Production designer, theatre/television/film  Tobacco Use   Smoking status: Light Smoker    Packs/day: 0.25    Years: 20.00    Additional pack years: 0.00    Total pack years: 5.00    Types: Cigarettes    Passive exposure: Never   Smokeless tobacco: Never  Vaping Use   Vaping Use: Never used  Substance and Sexual Activity   Alcohol use: Yes    Comment: a glass of wine,1-2 X A YEAR   Drug use: No   Sexual activity: Not on file  Other Topics Concern   Not on file  Social History Narrative   Not on file   Social Determinants of Health   Financial Resource Strain: Not on file  Food Insecurity: Not on file  Transportation Needs: Not on file  Physical Activity: Not on file  Stress: Not on file  Social Connections: Not on file    Review of Systems Per HPI  Objective:  BP 101/64   Pulse (!) 58   Temp 98.3 F (36.8 C)   Ht  (1.676 m)   Wt  165 lb (74.8 kg)   SpO2 98%   BMI 26.63 kg/m      06/08/2022    9:14 AM 03/04/2022    3:11 PM 01/18/2022    4:14 PM  BP/Weight  Systolic BP 101 110 122  Diastolic BP 64 80 72  Wt. (Lbs) 165 168.4 166.4  BMI 26.63 kg/m2 27.18 kg/m2 26.86 kg/m2    Physical Exam Vitals and nursing note reviewed.  Constitutional:      General: She is not in acute distress. HENT:     Head: Normocephalic and atraumatic.  Pulmonary:     Effort: Pulmonary effort is normal. No respiratory distress.  Skin:    Comments: Scattered areas of erythema with associated dryness.  Neurological:     Mental Status: She is alert.  Psychiatric:        Mood and Affect: Mood normal.        Behavior: Behavior normal.     Lab Results  Component Value Date   WBC 9.3 03/12/2021   HGB 13.4 03/12/2021   HCT 40.2 03/12/2021   PLT 358 03/12/2021   GLUCOSE 83 03/12/2021   CHOL 223 (H)  03/12/2021   TRIG 95 03/12/2021   HDL 72 03/12/2021   LDLCALC 134 (H) 03/12/2021   ALT 11 03/12/2021   AST 12 03/12/2021   NA 139 03/12/2021   K 4.1 03/12/2021   CL 101 03/12/2021   CREATININE 0.83 03/12/2021   BUN 9 03/12/2021   CO2 26 03/12/2021   TSH 1.600 02/01/2022   INR 0.91 01/27/2018     Assessment & Plan:   Problem List Items Addressed This Visit       Musculoskeletal and Integument   Rash - Primary    Patient concerned about shingles.  Exam is not consistent with shingles.  Areas are dry and mildly erythematous.  Trial of triamcinolone.  Referral to dermatology placed.      Relevant Orders   Ambulatory referral to Dermatology    Meds ordered this encounter  Medications   triamcinolone ointment (KENALOG) 0.5 %    Sig: Apply 1 Application topically 2 (two) times daily.    Dispense:  30 g    Refill:  0    Brittiney Dicostanzo DO [facility]

## 2022-06-29 ENCOUNTER — Telehealth: Payer: Self-pay

## 2022-06-29 NOTE — Telephone Encounter (Signed)
Pt has a phy coming up and wants to know if she needs lab work ordered?   Madison Aguilar [phone]

## 2022-07-02 ENCOUNTER — Ambulatory Visit (INDEPENDENT_AMBULATORY_CARE_PROVIDER_SITE_OTHER): Payer: 59 | Admitting: Family Medicine

## 2022-07-02 VITALS — Ht 65.0 in | Wt 165.0 lb

## 2022-07-02 DIAGNOSIS — Z13228 Encounter for screening for other metabolic disorders: Secondary | ICD-10-CM

## 2022-07-02 DIAGNOSIS — Z0001 Encounter for general adult medical examination with abnormal findings: Secondary | ICD-10-CM | POA: Diagnosis not present

## 2022-07-02 DIAGNOSIS — E785 Hyperlipidemia, unspecified: Secondary | ICD-10-CM | POA: Diagnosis not present

## 2022-07-02 DIAGNOSIS — E039 Hypothyroidism, unspecified: Secondary | ICD-10-CM | POA: Diagnosis not present

## 2022-07-02 DIAGNOSIS — Z114 Encounter for screening for human immunodeficiency virus [HIV]: Secondary | ICD-10-CM

## 2022-07-02 DIAGNOSIS — Z0000 Encounter for general adult medical examination without abnormal findings: Secondary | ICD-10-CM | POA: Insufficient documentation

## 2022-07-02 DIAGNOSIS — Z130 Encounter for screening for diseases of the blood and blood-forming organs and certain disorders involving the immune mechanism: Secondary | ICD-10-CM

## 2022-07-02 DIAGNOSIS — Z1159 Encounter for screening for other viral diseases: Secondary | ICD-10-CM

## 2022-07-02 MED ORDER — BUPROPION HCL ER (XL) 150 MG PO TB24: 150.0000 mg | ORAL_TABLET | Freq: Every day | ORAL | 1 refills | Status: DC

## 2022-07-02 NOTE — Assessment & Plan Note (Signed)
Labs today including HIV and hepatitis C screening. Declines shingles vaccine. Starting Wellbutrin for smoking cessation.

## 2022-07-02 NOTE — Telephone Encounter (Signed)
Patient given lab orders at office visit

## 2022-07-02 NOTE — Telephone Encounter (Signed)
ook, Jayce G, DO     CBC, CMP, Lipid, TSH.

## 2022-07-02 NOTE — Patient Instructions (Signed)
Start Wellbutrin.  Labs today.  Follow up in 6 months to 1 year.

## 2022-07-02 NOTE — Progress Notes (Signed)
Subjective:  Patient ID: Madison Aguilar, female    DOB: 1965-04-13  Age: 57 y.o. MRN: [id number]  CC: Chief Complaint  Patient presents with   Annual Exam    Patient has GYN for female exam    HPI:  57 year old female with GERD, hypothyroidism, hyperlipidemia, tobacco abuse presents for an annual physical exam.  Patient's mammogram and Pap smear are up-to-date.  She follows with OB/GYN.  Patient declines shingles vaccine.  Declines tetanus vaccine.  She does not desire any vaccines.  Patient desires hepatitis C and HIV screening.  Patient is a current smoker.  She smokes approximately 5 cigarettes a day.  She has been doing this since she was 57 years of age.  She is interested in smoking cessation and is also interested in CT lung cancer screening.  However, she currently does not qualify for CT lung cancer screening as she does not have a 20-pack-year history.  Patient would like to discuss treatment options regarding smoking cessation.  Patient Active Problem List   Diagnosis Date Noted   Annual physical exam 07/02/2022   Hyperlipidemia 03/04/2021   Primary localized osteoarthritis of right hip 01/24/2018   Hypothyroidism 10/02/2012   GERD (gastroesophageal reflux disease) 09/22/2010    Social Hx   Social History   Socioeconomic History   Marital status: Married    Spouse name: Not on file   Number of children: 1   Years of education: Not on file   Highest education level: Not on file  Occupational History   Occupation: [employer]    Comment: Production designer, theatre/television/film  Tobacco Use   Smoking status: Light Smoker    Packs/day: 0.25    Years: 20.00    Additional pack years: 0.00    Total pack years: 5.00    Types: Cigarettes    Passive exposure: Never   Smokeless tobacco: Never  Vaping Use   Vaping Use: Never used  Substance and Sexual Activity   Alcohol use: Yes    Comment: a glass of wine,1-2 X A YEAR   Drug use: No   Sexual activity: Not on file  Other Topics Concern    Not on file  Social History Narrative   Not on file   Social Determinants of Health   Financial Resource Strain: Not on file  Food Insecurity: Not on file  Transportation Needs: Not on file  Physical Activity: Not on file  Stress: Not on file  Social Connections: Not on file    Review of Systems  Constitutional:        Weight gain.  Respiratory:  Positive for cough.    Objective:  Ht 5\' 5"  (1.651 m)   Wt 165 lb (74.8 kg)   BMI 27.46 kg/m      07/02/2022    8:58 AM 06/08/2022    9:14 AM 03/04/2022    3:11 PM  BP/Weight  Systolic BP  101 782  Diastolic BP  64 80  Wt. (Lbs) 165 165 168.4  BMI 27.46 kg/m2 26.63 kg/m2 27.18 kg/m2    Physical Exam Vitals and nursing note reviewed.  Constitutional:      General: She is not in acute distress.    Appearance: Normal appearance.  HENT:     Head: Normocephalic and atraumatic.     Nose: Nose normal.  Eyes:     General:        Right eye: No discharge.        Left eye: No discharge.  Conjunctiva/sclera: Conjunctivae normal.  Cardiovascular:     Rate and Rhythm: Normal rate and regular rhythm.  Pulmonary:     Effort: Pulmonary effort is normal.     Breath sounds: Normal breath sounds. No wheezing, rhonchi or rales.  Abdominal:     General: There is no distension.     Palpations: Abdomen is soft.     Tenderness: There is no abdominal tenderness.  Musculoskeletal:     Cervical back: Neck supple.  Neurological:     Mental Status: She is alert.  Psychiatric:        Mood and Affect: Mood normal.        Behavior: Behavior normal.     Lab Results  Component Value Date   WBC 9.3 03/12/2021   HGB 13.4 03/12/2021   HCT 40.2 03/12/2021   PLT 358 03/12/2021   GLUCOSE 83 03/12/2021   CHOL 223 (H) 03/12/2021   TRIG 95 03/12/2021   HDL 72 03/12/2021   LDLCALC 134 (H) 03/12/2021   ALT 11 03/12/2021   AST 12 03/12/2021   NA 139 03/12/2021   K 4.1 03/12/2021   CL 101 03/12/2021   CREATININE 0.83 03/12/2021   BUN 9  03/12/2021   CO2 26 03/12/2021   TSH 1.600 02/01/2022   INR 0.91 01/27/2018     Assessment & Plan:   Problem List Items Addressed This Visit       Endocrine   Hypothyroidism   Relevant Orders   TSH     Other   Hyperlipidemia   Relevant Orders   Lipid panel   Annual physical exam - Primary    Labs today including HIV and hepatitis C screening. Declines shingles vaccine. Starting Wellbutrin for smoking cessation.      Other Visit Diagnoses     Screening for deficiency anemia       Relevant Orders   CBC   Screening for metabolic disorder       Relevant Orders   CMP14+EGFR   Encounter for screening for HIV       Relevant Orders   HIV antibody (with reflex)   Encounter for hepatitis C screening test for low risk patient       Relevant Orders   Hepatitis C antibody       Meds ordered this encounter  Medications   buPROPion (WELLBUTRIN XL) 150 MG 24 hr tablet    Sig: Take 1 tablet (150 mg total) by mouth daily.    Dispense:  90 tablet    Refill:  1    Follow-up:  6 months to 1 year.  Everlene Other DO [facility]

## 2022-07-03 LAB — CMP14+EGFR
ALT: 12 IU/L (ref 0–32)
AST: 14 IU/L (ref 0–40)
Albumin/Globulin Ratio: 1.7 (ref 1.2–2.2)
Albumin: 4.5 g/dL (ref 3.8–4.9)
Alkaline Phosphatase: 96 IU/L (ref 44–121)
BUN/Creatinine Ratio: 10 (ref 9–23)
BUN: 9 mg/dL (ref 6–24)
Bilirubin Total: 0.3 mg/dL (ref 0.0–1.2)
CO2: 23 mmol/L (ref 20–29)
Calcium: 9.5 mg/dL (ref 8.7–10.2)
Chloride: 99 mmol/L (ref 96–106)
Creatinine, Ser: 0.86 mg/dL (ref 0.57–1.00)
Globulin, Total: 2.6 g/dL (ref 1.5–4.5)
Glucose: 92 mg/dL (ref 70–99)
Potassium: 4.8 mmol/L (ref 3.5–5.2)
Sodium: 139 mmol/L (ref 134–144)
Total Protein: 7.1 g/dL (ref 6.0–8.5)
eGFR: 78 mL/min/{1.73_m2} (ref >59)

## 2022-07-03 LAB — LIPID PANEL
Chol/HDL Ratio: 3.3 ratio (ref 0.0–4.4)
Cholesterol, Total: 236 mg/dL — ABNORMAL HIGH (ref 100–199)
HDL: 72 mg/dL (ref >39)
LDL Chol Calc (NIH): 147 mg/dL — ABNORMAL HIGH (ref 0–99)
Triglycerides: 97 mg/dL (ref 0–149)
VLDL Cholesterol Cal: 17 mg/dL (ref 5–40)

## 2022-07-03 LAB — CBC
Hematocrit: 40.6 % (ref 34.0–46.6)
Hemoglobin: 12.9 g/dL (ref 11.1–15.9)
MCH: 28.1 pg (ref 26.6–33.0)
MCHC: 31.8 g/dL (ref 31.5–35.7)
MCV: 89 fL (ref 79–97)
Platelets: 389 10*3/uL (ref 150–450)
RBC: 4.59 x10E6/uL (ref 3.77–5.28)
RDW: 12.8 % (ref 11.7–15.4)
WBC: 7.6 10*3/uL (ref 3.4–10.8)

## 2022-07-03 LAB — HEPATITIS C ANTIBODY: Hep C Virus Ab: NONREACTIVE

## 2022-07-03 LAB — TSH: TSH: 1.27 u[IU]/mL (ref 0.450–4.500)

## 2022-07-03 LAB — HIV ANTIBODY (ROUTINE TESTING W REFLEX): HIV Screen 4th Generation wRfx: NONREACTIVE

## 2022-07-29 ENCOUNTER — Other Ambulatory Visit: Payer: Self-pay | Admitting: Family Medicine

## 2022-07-29 DIAGNOSIS — E039 Hypothyroidism, unspecified: Secondary | ICD-10-CM

## 2022-10-20 ENCOUNTER — Other Ambulatory Visit: Payer: Self-pay | Admitting: Family Medicine

## 2022-10-20 DIAGNOSIS — E039 Hypothyroidism, unspecified: Secondary | ICD-10-CM

## 2023-01-10 ENCOUNTER — Ambulatory Visit: Payer: 59 | Admitting: Family Medicine

## 2023-03-14 ENCOUNTER — Telehealth: Payer: Self-pay

## 2023-03-14 NOTE — Telephone Encounter (Signed)
---  pt has upcoming appt, would like to have labwork before the appt, please advise  Copied from CRM [id number]. Topic: Clinical - Request for Lab/Test Order >> Mar 14, 2023  9:47 AM Madison Aguilar wrote: Reason for CRM: Patient scheduled physical and wanted to do lab work before her 04/06/23 appt. Can doctor place orders? Cb# [phone]

## 2023-03-15 ENCOUNTER — Other Ambulatory Visit: Payer: Self-pay

## 2023-03-15 DIAGNOSIS — Z130 Encounter for screening for diseases of the blood and blood-forming organs and certain disorders involving the immune mechanism: Secondary | ICD-10-CM

## 2023-03-15 DIAGNOSIS — Z0000 Encounter for general adult medical examination without abnormal findings: Secondary | ICD-10-CM

## 2023-03-15 DIAGNOSIS — E785 Hyperlipidemia, unspecified: Secondary | ICD-10-CM

## 2023-03-15 NOTE — Telephone Encounter (Signed)
Pt has been informed of her lab orders

## 2023-04-06 ENCOUNTER — Encounter: Payer: Self-pay | Admitting: Family Medicine

## 2023-04-06 ENCOUNTER — Ambulatory Visit (INDEPENDENT_AMBULATORY_CARE_PROVIDER_SITE_OTHER): Payer: 59 | Admitting: Family Medicine

## 2023-04-06 VITALS — BP 117/74 | Temp 97.0°F | Ht 65.0 in | Wt 167.0 lb

## 2023-04-06 DIAGNOSIS — Z0000 Encounter for general adult medical examination without abnormal findings: Secondary | ICD-10-CM

## 2023-04-06 DIAGNOSIS — R0683 Snoring: Secondary | ICD-10-CM | POA: Diagnosis not present

## 2023-04-06 DIAGNOSIS — E785 Hyperlipidemia, unspecified: Secondary | ICD-10-CM

## 2023-04-06 DIAGNOSIS — E039 Hypothyroidism, unspecified: Secondary | ICD-10-CM

## 2023-04-06 DIAGNOSIS — Z130 Encounter for screening for diseases of the blood and blood-forming organs and certain disorders involving the immune mechanism: Secondary | ICD-10-CM

## 2023-04-06 DIAGNOSIS — Z13228 Encounter for screening for other metabolic disorders: Secondary | ICD-10-CM

## 2023-04-06 DIAGNOSIS — Z0001 Encounter for general adult medical examination with abnormal findings: Secondary | ICD-10-CM

## 2023-04-06 MED ORDER — LEVOTHYROXINE SODIUM 75 MCG PO TABS: ORAL_TABLET | ORAL | 1 refills | Status: DC

## 2023-04-06 NOTE — Patient Instructions (Addendum)
Labs today.  Medication refilled.  Referral placed.  Follow up annually.

## 2023-04-07 ENCOUNTER — Encounter: Payer: Self-pay | Admitting: Family Medicine

## 2023-04-07 NOTE — Assessment & Plan Note (Addendum)
Patient declines vaccines today.  Colonoscopy up-to-date.  Pap smear up-to-date although I need records.  Mammogram up-to-date. Labs ordered. Discussed smoking cessation treatment options.  Patient to consider Chantix. Placing referral to neurology for evaluation of possible sleep apnea.

## 2023-04-07 NOTE — Progress Notes (Signed)
Subjective:  Patient ID: Madison Aguilar, female    DOB: 08-26-1965  Age: 58 y.o. MRN: [id number]  CC:   Chief Complaint  Patient presents with   Annual Exam    Labs, sleep apnea tested for her and husband     HPI:  58 year old female presents for an annual exam.  Patient reports that she sees [facility].  Last Pap smear was done in 2024.  Need records.  Mammogram up-to-date.  Colonoscopy up-to-date.  She is a candidate for pneumococcal vaccine, shingles vaccine, and Tdap.  Also a candidate for RSV vaccination.  Will discuss today.  Patient does note that she snores quite a bit.  She is concerned about the possibility of sleep apnea.  Would like a sleep study.  Patient is a smoker.  She has tried Wellbutrin without success.  She is no longer taking this medication.  Will discuss today.  Patient Active Problem List   Diagnosis Date Noted   Annual physical exam 07/02/2022   Hyperlipidemia 03/04/2021   Smoker 03/14/2019   Primary localized osteoarthritis of right hip 01/24/2018   Hypothyroidism 10/02/2012   GERD (gastroesophageal reflux disease) 09/22/2010    Social Hx   Social History   Socioeconomic History   Marital status: Married    Spouse name: Not on file   Number of children: 1   Years of education: Not on file   Highest education level: Not on file  Occupational History   Occupation: [employer]    Comment: Production designer, theatre/television/film  Tobacco Use   Smoking status: Light Smoker    Current packs/day: 0.25    Average packs/day: 0.3 packs/day for 20.0 years (5.0 ttl pk-yrs)    Types: Cigarettes    Passive exposure: Never   Smokeless tobacco: Never  Vaping Use   Vaping status: Never Used  Substance and Sexual Activity   Alcohol use: Yes    Comment: a glass of wine,1-2 X A YEAR   Drug use: No   Sexual activity: Not on file  Other Topics Concern   Not on file  Social History Narrative   Not on file   Social Drivers of Health   Financial Resource Strain: Not on  file  Food Insecurity: Not on file  Transportation Needs: Not on file  Physical Activity: Not on file  Stress: Not on file  Social Connections: Not on file    Review of Systems Per HPI  Objective:  BP 117/74   Temp (!) 97 F (36.1 C)   Ht 5\' 5"  (1.651 m)   Wt 167 lb (75.8 kg)   SpO2 100%   BMI 27.79 kg/m      04/06/2023    2:43 PM 12/16/2022   11:23 AM 07/02/2022    8:58 AM  BP/Weight  Systolic BP 117 120   Diastolic BP 74 75   Wt. (Lbs) 167  165  BMI 27.79 kg/m2  27.46 kg/m2    Physical Exam Vitals and nursing note reviewed.  Constitutional:      General: She is not in acute distress.    Appearance: Normal appearance.  HENT:     Head: Normocephalic and atraumatic.  Eyes:     General:        Right eye: No discharge.        Left eye: No discharge.     Conjunctiva/sclera: Conjunctivae normal.  Cardiovascular:     Rate and Rhythm: Normal rate and regular rhythm.  Pulmonary:  Effort: Pulmonary effort is normal.     Breath sounds: Normal breath sounds. No wheezing, rhonchi or rales.  Neurological:     Mental Status: She is alert.  Psychiatric:        Mood and Affect: Mood normal.        Behavior: Behavior normal.     Lab Results  Component Value Date   WBC 7.6 07/02/2022   HGB 12.9 07/02/2022   HCT 40.6 07/02/2022   PLT 389 07/02/2022   GLUCOSE 92 07/02/2022   CHOL 236 (H) 07/02/2022   TRIG 97 07/02/2022   HDL 72 07/02/2022   LDLCALC 147 (H) 07/02/2022   ALT 12 07/02/2022   AST 14 07/02/2022   NA 139 07/02/2022   K 4.8 07/02/2022   CL 99 07/02/2022   CREATININE 0.86 07/02/2022   BUN 9 07/02/2022   CO2 23 07/02/2022   TSH 1.270 07/02/2022   INR 0.91 01/27/2018     Assessment & Plan:   Problem List Items Addressed This Visit       Endocrine   Hypothyroidism   Relevant Medications   levothyroxine (SYNTHROID) 75 MCG tablet   Other Relevant Orders   TSH     Other   Hyperlipidemia   Relevant Orders   Lipid panel   Annual physical  exam - Primary   Patient declines vaccines today.  Colonoscopy up-to-date.  Pap smear up-to-date although I need records.  Mammogram up-to-date. Labs ordered. Discussed smoking cessation treatment options.  Patient to consider Chantix. Placing referral to neurology for evaluation of possible sleep apnea.      Other Visit Diagnoses       Screening for deficiency anemia       Relevant Orders   CBC     Screening for metabolic disorder       Relevant Orders   CMP14+EGFR     Snoring       Relevant Orders   Ambulatory referral to Neurology       Meds ordered this encounter  Medications   levothyroxine (SYNTHROID) 75 MCG tablet    Sig: TAKE 2 TABLETS BY MOUTH ON SUNDAYS, TAKE 1 TABLET DAILY ON MONDAY THROUGH SATURDAY.    Dispense:  96 tablet    Refill:  1    Follow-up: Annually  Everlene Other DO [facility]

## 2023-04-20 LAB — CMP14+EGFR
ALT: 10 [IU]/L (ref 0–32)
AST: 14 [IU]/L (ref 0–40)
Albumin: 4.5 g/dL (ref 3.8–4.9)
Alkaline Phosphatase: 79 [IU]/L (ref 44–121)
BUN/Creatinine Ratio: 10 — ABNORMAL LOW (ref 12–28)
BUN: 10 mg/dL (ref 8–27)
Bilirubin Total: 0.3 mg/dL (ref 0.0–1.2)
CO2: 24 mmol/L (ref 20–29)
Calcium: 9.7 mg/dL (ref 8.7–10.3)
Chloride: 102 mmol/L (ref 96–106)
Creatinine, Ser: 0.98 mg/dL (ref 0.57–1.00)
Globulin, Total: 2.4 g/dL (ref 1.5–4.5)
Glucose: 94 mg/dL (ref 70–99)
Potassium: 4.2 mmol/L (ref 3.5–5.2)
Sodium: 139 mmol/L (ref 134–144)
Total Protein: 6.9 g/dL (ref 6.0–8.5)
eGFR: 66 mL/min/{1.73_m2} (ref >59)

## 2023-04-20 LAB — TSH: TSH: 0.865 u[IU]/mL (ref 0.450–4.500)

## 2023-04-20 LAB — CBC
Hematocrit: 39.6 % (ref 34.0–46.6)
Hemoglobin: 12.5 g/dL (ref 11.1–15.9)
MCH: 27.8 pg (ref 26.6–33.0)
MCHC: 31.6 g/dL (ref 31.5–35.7)
MCV: 88 fL (ref 79–97)
Platelets: 381 10*3/uL (ref 150–450)
RBC: 4.49 x10E6/uL (ref 3.77–5.28)
RDW: 13.6 % (ref 11.7–15.4)
WBC: 8.2 10*3/uL (ref 3.4–10.8)

## 2023-04-20 LAB — LIPID PANEL
Chol/HDL Ratio: 2.8 {ratio} (ref 0.0–4.4)
Cholesterol, Total: 218 mg/dL — ABNORMAL HIGH (ref 100–199)
HDL: 78 mg/dL (ref >39)
LDL Chol Calc (NIH): 129 mg/dL — ABNORMAL HIGH (ref 0–99)
Triglycerides: 65 mg/dL (ref 0–149)
VLDL Cholesterol Cal: 11 mg/dL (ref 5–40)

## 2023-04-21 ENCOUNTER — Telehealth: Payer: Self-pay

## 2023-04-21 NOTE — Telephone Encounter (Signed)
 Communication  Reason for CRM: Patient would like someone to give her a call back today if possible to discuss her lab results and medications and if they are changing due to lab results. Please call back on cell, which is her primary on file.

## 2023-04-24 ENCOUNTER — Encounter: Payer: Self-pay | Admitting: Family Medicine

## 2023-04-25 ENCOUNTER — Other Ambulatory Visit: Payer: Self-pay

## 2023-04-25 DIAGNOSIS — E039 Hypothyroidism, unspecified: Secondary | ICD-10-CM

## 2023-04-25 MED ORDER — LEVOTHYROXINE SODIUM 75 MCG PO TABS
ORAL_TABLET | ORAL | 1 refills | Status: AC
Start: 2023-04-25 — End: ?

## 2023-07-12 ENCOUNTER — Ambulatory Visit: Admitting: Family Medicine

## 2023-07-12 ENCOUNTER — Encounter: Payer: Self-pay | Admitting: Family Medicine

## 2023-07-12 VITALS — BP 113/70 | HR 53 | Temp 97.7°F | Ht 65.0 in | Wt 168.0 lb

## 2023-07-12 DIAGNOSIS — G8929 Other chronic pain: Secondary | ICD-10-CM | POA: Diagnosis not present

## 2023-07-12 DIAGNOSIS — G629 Polyneuropathy, unspecified: Secondary | ICD-10-CM | POA: Insufficient documentation

## 2023-07-12 DIAGNOSIS — M545 Low back pain, unspecified: Secondary | ICD-10-CM | POA: Insufficient documentation

## 2023-07-12 MED ORDER — DICLOFENAC SODIUM 75 MG PO TBEC: 75.0000 mg | DELAYED_RELEASE_TABLET | Freq: Two times a day (BID) | ORAL | 0 refills | Status: DC | PRN

## 2023-07-12 NOTE — Progress Notes (Signed)
 Subjective:  Patient ID: Madison Aguilar, female    DOB: 1965-08-30  Age: 58 y.o. MRN: [id number]  CC:   Chief Complaint  Patient presents with   Back Pain    Lower back pain ongoing for a year or more.. Recently gotten worse     HPI:  58 year old female presents for evaluation of the above.  Ongoing low back pain x 1 year. Recent worsening over the past 2 weeks. Located on the left side of the low back. Described as sharp. Worse with activity. Improves with rest and Ibuprofen. Sometimes pain radiates down the left leg.   Patient also reports ongoing pain in her feet bilaterally. Described as tingling and burning. Patient concerned that this is a vascular issue. States that this is not her primary concern today. No current pain in her feet.   Patient Active Problem List   Diagnosis Date Noted   Acute on chronic low back pain 07/12/2023   Neuropathy 07/12/2023   Annual physical exam 07/02/2022   Hyperlipidemia 03/04/2021   Smoker 03/14/2019   Primary localized osteoarthritis of right hip 01/24/2018   Hypothyroidism 10/02/2012   GERD (gastroesophageal reflux disease) 09/22/2010    Social Hx   Social History   Socioeconomic History   Marital status: Married    Spouse name: Not on file   Number of children: 1   Years of education: Not on file   Highest education level: Not on file  Occupational History   Occupation: [employer]    Comment: Production designer, theatre/television/film  Tobacco Use   Smoking status: Light Smoker    Current packs/day: 0.25    Average packs/day: 0.3 packs/day for 20.0 years (5.0 ttl pk-yrs)    Types: Cigarettes    Passive exposure: Never   Smokeless tobacco: Never  Vaping Use   Vaping status: Never Used  Substance and Sexual Activity   Alcohol use: Yes    Comment: a glass of wine,1-2 X A YEAR   Drug use: No   Sexual activity: Not on file  Other Topics Concern   Not on file  Social History Narrative   Not on file   Social Drivers of Health   Financial Resource  Strain: Not on file  Food Insecurity: Not on file  Transportation Needs: Not on file  Physical Activity: Not on file  Stress: Not on file  Social Connections: Not on file    Review of Systems Per HPI  Objective:  BP 113/70   Pulse (!) 53   Temp 97.7 F (36.5 C)   Ht 5\' 5"  (1.651 m)   Wt 168 lb (76.2 kg)   SpO2 99%   BMI 27.96 kg/m      07/12/2023    3:54 PM 04/06/2023    2:43 PM 12/16/2022   11:23 AM  BP/Weight  Systolic BP 113 117 120  Diastolic BP 70 74 75  Wt. (Lbs) 168 167   BMI 27.96 kg/m2 27.79 kg/m2     Physical Exam Constitutional:      General: She is not in acute distress.    Appearance: Normal appearance.  HENT:     Head: Normocephalic and atraumatic.  Cardiovascular:     Rate and Rhythm: Normal rate and regular rhythm.     Pulses:          Dorsalis pedis pulses are 2+ on the right side and 2+ on the left side.       Posterior tibial pulses are 2+ on  the right side and 2+ on the left side.  Pulmonary:     Effort: Pulmonary effort is normal.     Breath sounds: Normal breath sounds.  Neurological:     Mental Status: She is alert.  Psychiatric:        Mood and Affect: Mood normal.        Behavior: Behavior normal.     Lab Results  Component Value Date   WBC 8.2 04/19/2023   HGB 12.5 04/19/2023   HCT 39.6 04/19/2023   PLT 381 04/19/2023   GLUCOSE 94 04/19/2023   CHOL 218 (H) 04/19/2023   TRIG 65 04/19/2023   HDL 78 04/19/2023   LDLCALC 129 (H) 04/19/2023   ALT 10 04/19/2023   AST 14 04/19/2023   NA 139 04/19/2023   K 4.2 04/19/2023   CL 102 04/19/2023   CREATININE 0.98 04/19/2023   BUN 10 04/19/2023   CO2 24 04/19/2023   TSH 0.865 04/19/2023   INR 0.91 01/27/2018     Assessment & Plan:  Acute on chronic low back pain Assessment & Plan: Chronic low back pain with recent exacerbation/worsening.  Xray for further evaluation. Starting on Diclofenac .  Orders: -     DG Lumbar Spine Complete -     Diclofenac  Sodium; Take 1 tablet  (75 mg total) by mouth 2 (two) times daily as needed for moderate pain (pain score 4-6).  Dispense: 60 tablet; Refill: 0  Neuropathy Assessment & Plan: Bilateral feet pain likely from neuropathy. Will continue to monitor. Patient declines intervention at this time.    Follow-up:  Pending xray results  Kathleen Papa DO [facility]

## 2023-07-12 NOTE — Patient Instructions (Signed)
 Xray at the hospital.  Medication as directed.  We will call with results.

## 2023-07-12 NOTE — Assessment & Plan Note (Signed)
 Chronic low back pain with recent exacerbation/worsening.  Xray for further evaluation. Starting on Diclofenac .

## 2023-07-12 NOTE — Assessment & Plan Note (Signed)
 Bilateral feet pain likely from neuropathy. Will continue to monitor. Patient declines intervention at this time.

## 2023-07-14 ENCOUNTER — Ambulatory Visit: Payer: Self-pay | Admitting: Family Medicine

## 2023-07-14 ENCOUNTER — Ambulatory Visit (HOSPITAL_COMMUNITY)
Admission: RE | Admit: 2023-07-14 | Discharge: 2023-07-14 | Disposition: A | Discharge status: Home / Self Care | Source: Ambulatory Visit | Attending: Family Medicine | Admitting: Family Medicine

## 2023-07-14 DIAGNOSIS — M545 Low back pain, unspecified: Secondary | ICD-10-CM | POA: Diagnosis present

## 2023-07-14 DIAGNOSIS — G8929 Other chronic pain: Secondary | ICD-10-CM | POA: Diagnosis present

## 2023-08-23 ENCOUNTER — Ambulatory Visit: Admitting: Family Medicine

## 2023-08-24 ENCOUNTER — Ambulatory Visit: Admitting: Family Medicine

## 2023-08-24 ENCOUNTER — Encounter: Payer: Self-pay | Admitting: Family Medicine

## 2023-08-24 VITALS — BP 105/67 | HR 59 | Temp 98.4°F | Ht 65.0 in | Wt 166.0 lb

## 2023-08-24 DIAGNOSIS — G4452 New daily persistent headache (NDPH): Secondary | ICD-10-CM

## 2023-08-24 NOTE — Patient Instructions (Signed)
 Back off on use of medication to avoid medication overuse headache.  Message with concerns.  Consider imaging.

## 2023-08-25 DIAGNOSIS — G4452 New daily persistent headache (NDPH): Secondary | ICD-10-CM | POA: Insufficient documentation

## 2023-08-25 NOTE — Progress Notes (Signed)
 Subjective:  Patient ID: Madison Aguilar, female    DOB: 29-Oct-1965  Age: 58 y.o. MRN: [id number]  CC:   Chief Complaint  Patient presents with   Headache    Headaches off and on for past 14 days, in various parts of head.     HPI:  58 year old female presents for evaluation of the above.  Patient reports persistent/daily headache for the past 14 days.  She states that the pain typically is in the occipital region but also bounces around to other regions of the head.  She has been using Advil with some improvement but without complete resolution.  She states that it is a nagging and persistent headache.  She has been using Advil regularly over the past 2 weeks.  Denies any nausea or vomiting.  No significant photophobia.  She also does note that she has had some blepharospasm.  She has a history of migraine but states that this does not feel like a migraine.  Denies dizziness or any other associated symptoms.  Patient Active Problem List   Diagnosis Date Noted   New persistent daily headache 08/25/2023   Acute on chronic low back pain 07/12/2023   Neuropathy 07/12/2023   Annual physical exam 07/02/2022   Hyperlipidemia 03/04/2021   Smoker 03/14/2019   Primary localized osteoarthritis of right hip 01/24/2018   Hypothyroidism 10/02/2012   GERD (gastroesophageal reflux disease) 09/22/2010    Social Hx   Social History   Socioeconomic History   Marital status: Married    Spouse name: Not on file   Number of children: 1   Years of education: Not on file   Highest education level: Not on file  Occupational History   Occupation: [employer]    Comment: Production designer, theatre/television/film  Tobacco Use   Smoking status: Light Smoker    Current packs/day: 0.25    Average packs/day: 0.3 packs/day for 20.0 years (5.0 ttl pk-yrs)    Types: Cigarettes    Passive exposure: Never   Smokeless tobacco: Never  Vaping Use   Vaping status: Never Used  Substance and Sexual Activity   Alcohol use: Yes     Comment: a glass of wine,1-2 X A YEAR   Drug use: No   Sexual activity: Not on file  Other Topics Concern   Not on file  Social History Narrative   Not on file   Social Drivers of Health   Financial Resource Strain: Not on file  Food Insecurity: Not on file  Transportation Needs: Not on file  Physical Activity: Not on file  Stress: Not on file  Social Connections: Not on file    Review of Systems Per HPI  Objective:  BP 105/67   Pulse (!) 59   Temp 98.4 F (36.9 C)   Ht 5' 5 (1.651 m)   Wt 166 lb (75.3 kg)   SpO2 100%   BMI 27.62 kg/m      08/24/2023   11:25 AM 07/12/2023    3:54 PM 04/06/2023    2:43 PM  BP/Weight  Systolic BP 105 113 117  Diastolic BP 67 70 74  Wt. (Lbs) 166 168 167  BMI 27.62 kg/m2 27.96 kg/m2 27.79 kg/m2    Physical Exam  Lab Results  Component Value Date   WBC 8.2 04/19/2023   HGB 12.5 04/19/2023   HCT 39.6 04/19/2023   PLT 381 04/19/2023   GLUCOSE 94 04/19/2023   CHOL 218 (H) 04/19/2023   TRIG 65 04/19/2023   HDL  78 04/19/2023   LDLCALC 129 (H) 04/19/2023   ALT 10 04/19/2023   AST 14 04/19/2023   NA 139 04/19/2023   K 4.2 04/19/2023   CL 102 04/19/2023   CREATININE 0.98 04/19/2023   BUN 10 04/19/2023   CO2 24 04/19/2023   TSH 0.865 04/19/2023   INR 0.91 01/27/2018     Assessment & Plan:  New persistent daily headache Assessment & Plan: Discussed the possibility of CT imaging given new onset headache at her age.  She will consider.  Most likely tension versus medication overuse.  Advised to back off on use of over-the-counter NSAIDs.   Offered medication.  Patient wanted to wait.     Follow-up:  Return if symptoms worsen or fail to improve.  Jacqulyn Ahle DO [facility]

## 2023-08-25 NOTE — Assessment & Plan Note (Addendum)
 Discussed the possibility of CT imaging given new onset headache at her age.  She will consider.  Most likely tension versus medication overuse.  Advised to back off on use of over-the-counter NSAIDs.   Offered medication.  Patient wanted to wait.

## 2023-12-28 ENCOUNTER — Ambulatory Visit: Payer: Self-pay

## 2023-12-28 ENCOUNTER — Ambulatory Visit: Admitting: Physician Assistant

## 2023-12-28 ENCOUNTER — Encounter: Payer: Self-pay | Admitting: Physician Assistant

## 2023-12-28 VITALS — BP 124/71 | HR 68 | Temp 97.2°F | Ht 65.0 in | Wt 169.8 lb

## 2023-12-28 DIAGNOSIS — M545 Low back pain, unspecified: Secondary | ICD-10-CM

## 2023-12-28 DIAGNOSIS — G8929 Other chronic pain: Secondary | ICD-10-CM

## 2023-12-28 DIAGNOSIS — M65311 Trigger thumb, right thumb: Secondary | ICD-10-CM | POA: Diagnosis not present

## 2023-12-28 DIAGNOSIS — R590 Localized enlarged lymph nodes: Secondary | ICD-10-CM | POA: Insufficient documentation

## 2023-12-28 NOTE — Telephone Encounter (Signed)
 FYI Only or Action Required?: FYI only for provider: appointment scheduled on 12/28/23.  Patient was last seen in primary care on 08/24/2023 by Cook, Jayce G, DO.  Called Nurse Triage reporting Back Pain.  Symptoms began several days ago.  Interventions attempted: OTC medications: advil.  Symptoms are: unchanged.  Triage Disposition: See PCP When Office is Open (Within 3 Days)  Patient/caregiver understands and will follow disposition?: Yes   Copied from CRM [id number]. Topic: Clinical - Red Word Triage >> Dec 28, 2023  9:26 AM Ivette P wrote: [facility] that prompted transfer to Nurse Triage: thumb on right side, unable to move it. got a shot once before. cant remember what type of shot    lower back - hurting and unbearable. Ongoing  having swelling lyphnode under ear. and pain there. Reason for Disposition  [1] MODERATE back pain (e.g., interferes with normal activities) AND [2] present > 3 days  Answer Assessment - Initial Assessment Questions No available appts with pcp. Scheduled with alt provider, 12/28/23.  Advised call back or UC/ED if symptoms worsen.  1. ONSET: When did the pain begin? (e.g., minutes, hours, days)     Back pain; chronic, 5/10; advil; able to walk and stand, painful,denies problems b/b; lower left back Lymph node; under right ear, size pea, sore to touch, Monday; denies problems swallowing, sore throat, ear pain/ drainage, red streaks Right thumb; injection previously for trigger finger, requesting referral  5. RADIATION: Does the pain shoot into your legs or somewhere else?     Both leg, denies swelling, discolored 6. CAUSE:  What do you think is causing the back pain?      chronic  9. NEUROLOGIC SYMPTOMS: Do you have any weakness, numbness, or problems with bowel/bladder control?     No problems with b/b, intermittenatn weakness, numbness 10. OTHER SYMPTOMS: Do you have any other symptoms? (e.g., fever, abdomen pain, burning with urination,  blood in urine) Denies fever, chills, n/v, abd pain, pain with urination/blood  Protocols used: Back Pain-A-AH

## 2023-12-28 NOTE — Telephone Encounter (Signed)
 Appointment scheduled.

## 2023-12-28 NOTE — Patient Instructions (Signed)
 [facility] [place]  [address]  [place], [place] [address]  [phone]

## 2023-12-28 NOTE — Assessment & Plan Note (Addendum)
 Chronic low back pain with left-sided radiation, possibly due to nerve compression or sciatica from degenerative changes. She is neurologically intact today. Previous x-ray showed minimal changes. MRI requested for further evaluation, insurance coverage uncertain. - Order MRI of the lower back to assess for nerve compression or other issues. - Provide exercises and stretches for back pain management. - Offer topical diclofenac  gel for pain relief. Advise ibuprofen or naproxen  for pain relief as needed. Patient advised to stay active.  - If MRI is not covered by insurance, plan for physical therapy referral. - Warning signs and follow up precautions discussed.

## 2023-12-28 NOTE — Assessment & Plan Note (Signed)
 Chronic right thumb pain worsening over 2-3 weeks, affecting daily activities. Previous injection provided temporary relief. I suspect recurrence of trigger finger. - Refer back to [facility] in [place] for evaluation and possible injection. - Advise use of ibuprofen, acetaminophen , ice, and heat for symptomatic relief until orthopedic appointment.

## 2023-12-28 NOTE — Progress Notes (Signed)
 Acute Office Visit  Subjective:     Patient ID: Madison Aguilar, female    DOB: [birth year], 58 y.o.   MRN: [id number]   Discussed the use of AI scribe software for clinical note transcription with the patient, who gave verbal consent to proceed.  History of Present Illness Madison Aguilar is a 58 year old female who presents with severe right thumb pain and left lower back pain.  She experiences severe right thumb pain for the past two to three weeks, interfering with daily activities and work. The pain feels like 'bone on bone' with a popping sensation and sometimes radiates down her arm. An injection a year ago provided temporary relief.  Lower back pain is persistent, almost unbearable at times, located on the left side, and sometimes radiates down her leg. She had a hip replacement in 2019 but does not associate the pain with her hip. The pain has been present for over a year. Previous x-ray noted minimal degenerative changes. Patient is interested in an MRI for further evaluation.  She noticed swelling in a lymph node on the right side of her neck a couple of days ago, which was initially painful. She denies pain today and relates lymph node seems smaller in size. There are no associated symptoms such as fever, night sweats, or body aches.  She manages her pain with ibuprofen and Tylenol  and prefers to avoid stronger medications due to work and caregiving responsibilities.   Review of Systems  Constitutional:  Negative for chills, fever and malaise/fatigue.  Musculoskeletal:  Positive for back pain and joint pain. Negative for falls and myalgias.  Neurological:  Positive for sensory change (intermittent tingling/numbess in left leg). Negative for dizziness and headaches.        Objective:     BP 124/71 (BP Location: Left Arm, Patient Position: Sitting)   Pulse 68   Temp (!) 97.2 F (36.2 C)   Ht 5' 5 (1.651 m)   Wt 169 lb 12 oz (77 kg)   SpO2 99%   BMI 28.25 kg/m    Physical Exam Constitutional:      General: She is not in acute distress.    Appearance: Normal appearance. She is normal weight. She is not ill-appearing.  HENT:     Head: Normocephalic and atraumatic.     Mouth/Throat:     Mouth: Mucous membranes are moist.     Pharynx: Oropharynx is clear.  Eyes:     Extraocular Movements: Extraocular movements intact.     Conjunctiva/sclera: Conjunctivae normal.  Neck:     Comments: 1 swollen lymph node on patients right side neck just inferior to her ear Cardiovascular:     Rate and Rhythm: Normal rate and regular rhythm.     Heart sounds: Normal heart sounds. No murmur heard. Pulmonary:     Effort: Pulmonary effort is normal.     Breath sounds: Normal breath sounds.  Musculoskeletal:     Right hand: Tenderness present. No swelling. Decreased range of motion. Normal sensation. Normal capillary refill. Normal pulse.     Lumbar back: Tenderness present. No deformity. Decreased range of motion.     Comments: Pain and decreased ROM in the right thumb, with popping sensation upon movement, small nodule noted at proximal aspect of thumb  Skin:    General: Skin is warm and dry.  Neurological:     General: No focal deficit present.     Mental Status: She is alert and oriented to person,  place, and time.  Psychiatric:        Mood and Affect: Mood normal.        Behavior: Behavior normal.     No results found for any visits on 12/28/23.      Assessment & Plan:  Trigger finger of right thumb Assessment & Plan: Chronic right thumb pain worsening over 2-3 weeks, affecting daily activities. Previous injection provided temporary relief. I suspect recurrence of trigger finger. - Refer back to [facility] in [place] for evaluation and possible injection. - Advise use of ibuprofen, acetaminophen , ice, and heat for symptomatic relief until orthopedic appointment.   Acute on chronic low back  pain Assessment & Plan: Chronic low back pain with left-sided radiation, possibly due to nerve compression or sciatica from degenerative changes. She is neurologically intact today. Previous x-ray showed minimal changes. MRI requested for further evaluation, insurance coverage uncertain. - Order MRI of the lower back to assess for nerve compression or other issues. - Provide exercises and stretches for back pain management. - Offer topical diclofenac  gel for pain relief. Advise ibuprofen or naproxen  for pain relief as needed. Patient advised to stay active.  - If MRI is not covered by insurance, plan for physical therapy referral. - Warning signs and follow up precautions discussed.   Orders: -     MR LUMBAR SPINE WO CONTRAST  Enlarged lymph node in neck Assessment & Plan: Swollen right preauricular lymph node decreasing in size, likely reactive lymphadenopathy from minor illness. No systemic symptoms present. - Order CBC to check for infection or other abnormalities. - Advise monitoring of lymph node for changes in size or associated symptoms.  Orders: -     CBC with Differential/Platelet   Return if symptoms worsen or fail to improve.  Charmaine Terrina Docter, PA-C

## 2023-12-28 NOTE — Assessment & Plan Note (Signed)
 Swollen right preauricular lymph node decreasing in size, likely reactive lymphadenopathy from minor illness. No systemic symptoms present. - Order CBC to check for infection or other abnormalities. - Advise monitoring of lymph node for changes in size or associated symptoms.

## 2023-12-29 LAB — CBC WITH DIFFERENTIAL/PLATELET
Basophils Absolute: 0.1 x10E3/uL (ref 0.0–0.2)
Basos: 1 %
EOS (ABSOLUTE): 0.6 x10E3/uL — ABNORMAL HIGH (ref 0.0–0.4)
Eos: 6 %
Hematocrit: 38.5 % (ref 34.0–46.6)
Hemoglobin: 11.9 g/dL (ref 11.1–15.9)
Immature Grans (Abs): 0.1 x10E3/uL (ref 0.0–0.1)
Immature Granulocytes: 1 %
Lymphocytes Absolute: 3.3 x10E3/uL — ABNORMAL HIGH (ref 0.7–3.1)
Lymphs: 34 %
MCH: 27.9 pg (ref 26.6–33.0)
MCHC: 30.9 g/dL — ABNORMAL LOW (ref 31.5–35.7)
MCV: 90 fL (ref 79–97)
Monocytes Absolute: 0.8 x10E3/uL (ref 0.1–0.9)
Monocytes: 8 %
Neutrophils Absolute: 4.7 x10E3/uL (ref 1.4–7.0)
Neutrophils: 50 %
Platelets: 400 x10E3/uL (ref 150–450)
RBC: 4.27 x10E6/uL (ref 3.77–5.28)
RDW: 14 % (ref 11.7–15.4)
WBC: 9.6 x10E3/uL (ref 3.4–10.8)

## 2024-01-02 ENCOUNTER — Ambulatory Visit: Payer: Self-pay | Admitting: Physician Assistant

## 2024-01-16 ENCOUNTER — Ambulatory Visit (HOSPITAL_COMMUNITY)

## 2024-02-10 ENCOUNTER — Other Ambulatory Visit: Payer: Self-pay | Admitting: Family Medicine

## 2024-02-10 DIAGNOSIS — E039 Hypothyroidism, unspecified: Secondary | ICD-10-CM

## 2024-03-26 ENCOUNTER — Ambulatory Visit: Payer: Self-pay

## 2024-03-26 ENCOUNTER — Telehealth: Payer: Self-pay

## 2024-03-26 NOTE — Telephone Encounter (Signed)
 Copied from CRM [id number]. Topic: Clinical - Lab/Test Results >> Mar 26, 2024  2:15 PM Geneva B wrote: Reason for CRM: patient is needing blood work order

## 2024-03-26 NOTE — Telephone Encounter (Signed)
 Patient has an appointment 03/27/2024

## 2024-03-27 ENCOUNTER — Ambulatory Visit: Admitting: Family Medicine

## 2024-03-28 ENCOUNTER — Ambulatory Visit (HOSPITAL_COMMUNITY)
Admission: RE | Admit: 2024-03-28 | Discharge: 2024-03-28 | Disposition: A | Source: Ambulatory Visit | Attending: Family Medicine | Admitting: Family Medicine

## 2024-03-28 ENCOUNTER — Other Ambulatory Visit: Payer: Self-pay | Admitting: Family Medicine

## 2024-03-28 ENCOUNTER — Ambulatory Visit: Admitting: Family Medicine

## 2024-03-28 VITALS — BP 122/76 | HR 66 | Temp 97.3°F | Ht 65.0 in | Wt 170.2 lb

## 2024-03-28 DIAGNOSIS — E039 Hypothyroidism, unspecified: Secondary | ICD-10-CM

## 2024-03-28 DIAGNOSIS — E785 Hyperlipidemia, unspecified: Secondary | ICD-10-CM

## 2024-03-28 DIAGNOSIS — M79602 Pain in left arm: Secondary | ICD-10-CM

## 2024-03-28 DIAGNOSIS — Z13228 Encounter for screening for other metabolic disorders: Secondary | ICD-10-CM

## 2024-03-28 DIAGNOSIS — Z130 Encounter for screening for diseases of the blood and blood-forming organs and certain disorders involving the immune mechanism: Secondary | ICD-10-CM

## 2024-03-28 MED ORDER — MELOXICAM 15 MG PO TABS: 15.0000 mg | ORAL_TABLET | Freq: Every day | ORAL | 0 refills | Status: AC | PRN

## 2024-03-28 MED ORDER — LEVOTHYROXINE SODIUM 75 MCG PO TABS: ORAL_TABLET | ORAL | 1 refills | Status: AC

## 2024-03-28 NOTE — Progress Notes (Unsigned)
 "  Subjective:  Patient ID: ELYSSE Aguilar, female    DOB: 1965-05-08  Age: 59 y.o. MRN: [id number]  CC:   Chief Complaint  Patient presents with   Shoulder Pain    Patient is here for having left shoulder pain that runs to the back of the arm down to the finger. Fingers do have a tingling and numbness to them. Consistent pain     HPI:  59 year old female presents for evaluation of the above.    Patient Active Problem List   Diagnosis Date Noted   Trigger finger of right thumb 12/28/2023   Neuropathy 07/12/2023   Hyperlipidemia 03/04/2021   Smoker 03/14/2019   Primary localized osteoarthritis of right hip 01/24/2018   Hypothyroidism 10/02/2012   GERD (gastroesophageal reflux disease) 09/22/2010    Social Hx   Social History   Socioeconomic History   Marital status: Married    Spouse name: Not on file   Number of children: 1   Years of education: Not on file   Highest education level: Not on file  Occupational History   Occupation: [employer]    Comment: production designer, theatre/television/film  Tobacco Use   Smoking status: Light Smoker    Current packs/day: 0.25    Average packs/day: 0.3 packs/day for 20.0 years (5.0 ttl pk-yrs)    Types: Cigarettes    Passive exposure: Never   Smokeless tobacco: Never  Vaping Use   Vaping status: Never Used  Substance and Sexual Activity   Alcohol use: Yes    Comment: a glass of wine,1-2 X A YEAR   Drug use: No   Sexual activity: Not on file  Other Topics Concern   Not on file  Social History Narrative   Not on file   Social Drivers of Health   Tobacco Use: High Risk (01/11/2024)   Received from [facility]   Patient History    Smoking Tobacco Use: Every Day    Smokeless Tobacco Use: Never    Passive Exposure: Not on file  Financial Resource Strain: Not on file  Food Insecurity: Low Risk (01/11/2024)   Received from [facility]   Epic    Within the past 12 months, you worried that your food would run out before  you got money to buy more: Never true    Within the past 12 months, the food you bought just didn't last and you didn't have money to get more. : Never true  Transportation Needs: No Transportation Needs (01/11/2024)   Received from [facility]    In the past 12 months, has lack of reliable transportation kept you from medical appointments, meetings, work or from getting things needed for daily living? : No  Physical Activity: Not on file  Stress: Not on file  Social Connections: Not on file  Depression (PHQ2-9): Low Risk (12/28/2023)   Depression (PHQ2-9)    PHQ-2 Score: 4  Alcohol Screen: Not on file  Housing: Low Risk (01/11/2024)   Received from [facility]   Epic    What is your living situation today?: I have a steady place to live    Think about the place you live. Do you have problems with any of the following? Choose all that apply:: None/None on this list  Utilities: Low Risk (01/11/2024)   Received from [facility]   Utilities    In the past 12 months has the electric, gas, oil, or water company threatened to shut off services in  your home? : No  Health Literacy: Not on file    Review of Systems Per HPI  Objective:  BP 122/76 (BP Location: Left Arm, Patient Position: Sitting)   Pulse 66   Temp (!) 97.3 F (36.3 C)   Ht 5' 5 (1.651 m)   Wt 170 lb 4 oz (77.2 kg)   SpO2 94%   BMI 28.33 kg/m      03/28/2024    4:19 PM 12/28/2023    2:56 PM 08/24/2023   11:25 AM  BP/Weight  Systolic BP 122 124 105  Diastolic BP 76 71 67  Wt. (Lbs) 170.25 169.75 166  BMI 28.33 kg/m2 28.25 kg/m2 27.62 kg/m2    Physical Exam  Lab Results  Component Value Date   WBC 9.6 12/28/2023   HGB 11.9 12/28/2023   HCT 38.5 12/28/2023   PLT 400 12/28/2023   GLUCOSE 94 04/19/2023   CHOL 218 (H) 04/19/2023   TRIG 65 04/19/2023   HDL 78 04/19/2023   LDLCALC 129 (H) 04/19/2023   ALT 10 04/19/2023   AST 14 04/19/2023   NA 139 04/19/2023   K 4.2 04/19/2023    CL 102 04/19/2023   CREATININE 0.98 04/19/2023   BUN 10 04/19/2023   CO2 24 04/19/2023   TSH 0.865 04/19/2023   INR 0.91 01/27/2018     Assessment & Plan:  Left arm pain -     DG Cervical Spine Complete -     DG Shoulder Left  Hypothyroidism, unspecified type -     Levothyroxine  Sodium; TAKE 2 TABLETS BY MOUTH ON SUNDAYS, TAKE 1 TABLET BY MOUTH MONDAY THROUGH SATURDAY  Dispense: 96 tablet; Refill: 1  Other orders -     Meloxicam ; Take 1 tablet (15 mg total) by mouth daily as needed for pain.  Dispense: 30 tablet; Refill: 0    Follow-up:  No follow-ups on file.  Jacqulyn Ahle DO [facility] "

## 2024-03-28 NOTE — Patient Instructions (Signed)
 Shoulder vs muscular vs coming from the neck.  Xrays at the hospital.  Medication as directed.

## 2024-03-29 DIAGNOSIS — M79602 Pain in left arm: Secondary | ICD-10-CM | POA: Insufficient documentation

## 2024-03-29 NOTE — Assessment & Plan Note (Signed)
 Rotator cuff pathology versus radiculopathy versus muscular.  X-rays for further evaluation.  Meloxicam  as directed.
